# Patient Record
Sex: Female | Born: 2000 | Race: White | Hispanic: No | Marital: Single | Smoking: Never smoker
Health system: Southern US, Community
[De-identification: ages and names within clinical notes are randomized; demographics above are authoritative.]

## PROBLEM LIST (undated history)

## (undated) HISTORY — PX: TONSILLECTOMY: SUR1361

---
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
## 2019-01-12 DIAGNOSIS — Z20828 Contact with and (suspected) exposure to other viral communicable diseases: Secondary | ICD-10-CM | POA: Diagnosis not present

## 2019-01-17 DIAGNOSIS — Z20828 Contact with and (suspected) exposure to other viral communicable diseases: Secondary | ICD-10-CM | POA: Diagnosis not present

## 2019-03-16 ENCOUNTER — Telehealth: Payer: Self-pay | Admitting: *Deleted

## 2019-03-16 NOTE — Telephone Encounter (Signed)
Called pt to go over screening questions and phone only rang and then said call could not be completed at this time.Daielle Melcher Zimmerman Rumple, CMA

## 2019-03-17 ENCOUNTER — Ambulatory Visit (INDEPENDENT_AMBULATORY_CARE_PROVIDER_SITE_OTHER): Payer: Medicaid Other | Admitting: Family Medicine

## 2019-03-17 ENCOUNTER — Encounter: Payer: Self-pay | Admitting: Family Medicine

## 2019-03-17 ENCOUNTER — Ambulatory Visit (HOSPITAL_COMMUNITY)
Admission: RE | Admit: 2019-03-17 | Discharge: 2019-03-17 | Disposition: A | Discharge status: Home / Self Care | Payer: Medicaid Other | Source: Ambulatory Visit | Attending: Family Medicine | Admitting: Family Medicine

## 2019-03-17 ENCOUNTER — Other Ambulatory Visit: Payer: Self-pay

## 2019-03-17 VITALS — BP 94/54 | HR 99 | Wt 148.0 lb

## 2019-03-17 DIAGNOSIS — R55 Syncope and collapse: Secondary | ICD-10-CM | POA: Insufficient documentation

## 2019-03-17 NOTE — Progress Notes (Signed)
CHIEF COMPLAINT / HPI:  Syncope Kerri Mann reports an episode of syncope 3 days ago.  Late Sunday night/early Monday morning, around 4 AM, she woke and went to the bathroom.  Her sister was also present to the bathroom at that time and witnessed this event.  She entered the bathroom began to feel a tightness or heaviness in her chest followed shortly by a loss of hearing and unfocused vision.  She lost consciousness and fell to the ground with the aid of her sister.  Her sister reports that she stopped breathing for a period of 10 seconds while she was on the ground.  She noted some mild spasms of the arm but no generalized shaking.  No evidence of foaming at the mouth, urinary or fecal incontinence.  She regained consciousness without clear evidence of confusion and stood up.  Upon standing, she again passed out.  When she regained consciousness from the second syncopal episode, she had 1 episode of NB/NB emesis.  She called the clinic later that morning to schedule an appointment to be evaluated.  She previously experienced multiple syncopal episodes when she was 12.  At that time, she experiences episodes as often as twice per week.  She was ultimately evaluated by neurology at which time an EEG was performed and showed no evidence of epilepsy.  She was not put on any medication and ultimately these episodes resolved.  She has had no trouble with syncopal episodes since she was 12 (6 years ago).  She has not experienced similar episodes with exertion though she notes that she frequently feels dizzy with exercise.  She ate a normal meal on Sunday night and does not recall being significantly dehydrated though she notes that she may be someone who does not drink quite as much water as she should.  She reports that she has a healthy relationship with food and has never struggled with body image or eating disorders.  She is currently on birth control.  Mom notes that she has good/healthy relationships  with her friends and family at the moment.  She is not currently in a romantic relationship.  Her mother has some heart issue but she is not positive what it is.  It may be a heart block or an arrhythmia. Kerri Mann has never been evaluated by cardiology.  PERTINENT  PMH / PSH: See above   OBJECTIVE: BP (!) 94/54   Pulse 99   Wt 148 lb (67.1 kg)   SpO2 98%   BMI 27.07 kg/m    General: Alert and cooperative and appears to be in no acute distress Cardio: Normal S1 and S2, no S3 or S4. Rhythm is regular. No murmurs or rubs.   Pulm: Clear to auscultation bilaterally, no crackles, wheezing, or diminished breath sounds. Normal respiratory effort Abdomen: Bowel sounds normal. Abdomen soft and non-tender.  Extremities: No peripheral edema. Warm/ well perfused.  Strong radial and pedal pulses.  Orthostatic vitals as follows  lying down: HR: 95; BP 92/50 Sitting: HR 92; BP 88/44 Standing: HR 124; BP 80/54  ASSESSMENT / PLAN:  Syncope Orthostatic vitals show no significant changes in blood pressure with position changes though an increase in heart rate by 32 beats per second when moving from a seated to standing position.  Dehydration may be contributing to this current episode although there may be more contributing.  Based on a family history of heart issues and a personal history of syncope previously, I would like to investigate further before  settling on dehydration and orthostatic hypotension. -Normal EKG in clinic today -Ambulatory referral to cardiology -Follow-up TSH, CBC, BMP -Discouraged significant exertions prior to evaluation by cardiology -Encouraged to drink more fluids     Matilde Haymaker, MD [place]

## 2019-03-17 NOTE — Patient Instructions (Signed)
I am not positive what led to her syncopal episode.  This may ultimately be nothing.  I do think we should get some basic labs today and I would like you to see cardiology to make sure this is related to your heart.  I will call you with the lab results.  Please call the clinic if you have not heard setting of a cardiology appointment in the next 2 weeks.

## 2019-03-17 NOTE — Assessment & Plan Note (Addendum)
Orthostatic vitals show no significant changes in blood pressure with position changes though an increase in heart rate by 32 beats per second when moving from a seated to standing position.  Dehydration may be contributing to this current episode although there may be more contributing.  Based on a family history of heart issues and a personal history of syncope previously, I would like to investigate further before settling on dehydration and orthostatic hypotension. -Normal EKG in clinic today -Ambulatory referral to cardiology -Follow-up TSH, CBC, BMP -Discouraged significant exertions prior to evaluation by cardiology -Encouraged to drink more fluids

## 2019-03-18 ENCOUNTER — Encounter: Payer: Self-pay | Admitting: Family Medicine

## 2019-03-18 LAB — BASIC METABOLIC PANEL
BUN/Creatinine Ratio: 14 (ref 9–23)
BUN: 10 mg/dL (ref 6–20)
CO2: 21 mmol/L (ref 20–29)
Calcium: 9.4 mg/dL (ref 8.7–10.2)
Chloride: 104 mmol/L (ref 96–106)
Creatinine, Ser: 0.71 mg/dL (ref 0.57–1.00)
GFR calc Af Amer: 143 mL/min/{1.73_m2} (ref >59)
GFR calc non Af Amer: 124 mL/min/{1.73_m2} (ref >59)
Glucose: 86 mg/dL (ref 65–99)
Potassium: 3.9 mmol/L (ref 3.5–5.2)
Sodium: 140 mmol/L (ref 134–144)

## 2019-03-18 LAB — CBC
Hematocrit: 43.5 % (ref 34.0–46.6)
Hemoglobin: 15.2 g/dL (ref 11.1–15.9)
MCH: 30.2 pg (ref 26.6–33.0)
MCHC: 34.9 g/dL (ref 31.5–35.7)
MCV: 86 fL (ref 79–97)
Platelets: 260 10*3/uL (ref 150–450)
RBC: 5.04 x10E6/uL (ref 3.77–5.28)
RDW: 12.2 % (ref 11.7–15.4)
WBC: 6 10*3/uL (ref 3.4–10.8)

## 2019-03-18 LAB — TSH: TSH: 2.06 u[IU]/mL (ref 0.450–4.500)

## 2019-03-30 ENCOUNTER — Telehealth: Payer: Self-pay | Admitting: Family Medicine

## 2019-03-30 NOTE — Telephone Encounter (Signed)
Will forward to MD to place referral for oral surgeon.  Reise Hietala,CMA

## 2019-03-30 NOTE — Telephone Encounter (Signed)
Patient calling for a referral to an oral surgeon to get her wisdom teeth taken out. Patient says her wisdom teeth have become very painful since last Friday and its making her run a fever and miss work. Patient dentist cannot see her until end of March. Please call patient with an update.

## 2019-04-07 ENCOUNTER — Other Ambulatory Visit: Payer: Self-pay | Admitting: Family Medicine

## 2019-04-26 DIAGNOSIS — R000 Tachycardia, unspecified: Secondary | ICD-10-CM | POA: Diagnosis not present

## 2019-04-26 DIAGNOSIS — R1031 Right lower quadrant pain: Secondary | ICD-10-CM | POA: Diagnosis not present

## 2019-04-26 DIAGNOSIS — N10 Acute tubulo-interstitial nephritis: Secondary | ICD-10-CM | POA: Diagnosis not present

## 2019-04-26 DIAGNOSIS — N3289 Other specified disorders of bladder: Secondary | ICD-10-CM | POA: Diagnosis not present

## 2019-04-26 DIAGNOSIS — E876 Hypokalemia: Secondary | ICD-10-CM | POA: Diagnosis not present

## 2019-04-26 DIAGNOSIS — N12 Tubulo-interstitial nephritis, not specified as acute or chronic: Secondary | ICD-10-CM | POA: Diagnosis not present

## 2019-04-26 DIAGNOSIS — N200 Calculus of kidney: Secondary | ICD-10-CM | POA: Diagnosis not present

## 2019-04-27 DIAGNOSIS — N12 Tubulo-interstitial nephritis, not specified as acute or chronic: Secondary | ICD-10-CM | POA: Diagnosis not present

## 2019-04-27 DIAGNOSIS — E876 Hypokalemia: Secondary | ICD-10-CM | POA: Diagnosis not present

## 2019-04-27 DIAGNOSIS — R000 Tachycardia, unspecified: Secondary | ICD-10-CM | POA: Diagnosis not present

## 2019-04-29 MED ORDER — ACETAMINOPHEN 160 MG/5ML PO SUSP
650.00 | ORAL | Status: DC
Start: ? — End: 2019-04-29

## 2019-04-29 MED ORDER — ENOXAPARIN SODIUM 40 MG/0.4ML ~~LOC~~ SOLN
40.00 | SUBCUTANEOUS | Status: DC
Start: 2019-04-28 — End: 2019-04-29

## 2019-04-29 MED ORDER — POTASSIUM CHLORIDE IN NACL 20-0.9 MEQ/L-% IV SOLN
100.00 | INTRAVENOUS | Status: DC
Start: ? — End: 2019-04-29

## 2019-04-29 MED ORDER — GENERIC EXTERNAL MEDICATION
1.00 | Status: DC
Start: ? — End: 2019-04-29

## 2019-04-29 MED ORDER — GENERIC EXTERNAL MEDICATION
Status: DC
Start: ? — End: 2019-04-29

## 2019-04-29 MED ORDER — MORPHINE SULFATE (PF) 2 MG/ML IV SOLN
2.00 | INTRAVENOUS | Status: DC
Start: ? — End: 2019-04-29

## 2019-04-29 NOTE — Progress Notes (Signed)
Patient already has wisdom tooth extraction scheduled for 3/25 with her dentist. Was just hospitalized with Pyelonephritis, treated and discharged. It was recommended she reschedule the tooth extraction once she has recovered from the pyelonephritis.  Peggyann Shoals, DO [facility], PGY-2 04/29/2019 4:42 AM

## 2019-05-03 ENCOUNTER — Ambulatory Visit: Payer: Medicaid Other

## 2019-05-06 ENCOUNTER — Ambulatory Visit: Payer: Medicaid Other | Admitting: Family Medicine

## 2019-05-09 NOTE — Progress Notes (Deleted)
    SUBJECTIVE:   CHIEF COMPLAINT / HPI:   Hospital f/u - pyelonephritis  Admitted from 3/22-3/23 for pyelonephritis. Per Dc summary patient was treated with CTX and discharged with cefdinir. Urine cx showed sensitivity to CTX.   Sinus tachycardia present on admission. Patient improved from 140s to 112 at discharge. Thought to be 2/2 infectious.   PERTINENT  PMH / PSH: ***  OBJECTIVE:   There were no vitals taken for this visit.  ***  ASSESSMENT/PLAN:   No problem-specific Assessment & Plan notes found for this encounter.     Oralia Manis, DO [facility]

## 2019-05-10 ENCOUNTER — Ambulatory Visit: Payer: Medicaid Other | Admitting: Family Medicine

## 2019-05-11 ENCOUNTER — Ambulatory Visit: Payer: Medicaid Other | Admitting: Family Medicine

## 2019-06-18 ENCOUNTER — Encounter: Payer: Self-pay | Admitting: Family Medicine

## 2019-06-18 ENCOUNTER — Ambulatory Visit (INDEPENDENT_AMBULATORY_CARE_PROVIDER_SITE_OTHER): Payer: Medicaid Other | Admitting: Family Medicine

## 2019-06-18 ENCOUNTER — Other Ambulatory Visit: Payer: Self-pay

## 2019-06-18 VITALS — BP 118/72 | HR 98 | Wt 152.2 lb

## 2019-06-18 DIAGNOSIS — R55 Syncope and collapse: Secondary | ICD-10-CM

## 2019-06-18 DIAGNOSIS — Z3042 Encounter for surveillance of injectable contraceptive: Secondary | ICD-10-CM | POA: Diagnosis not present

## 2019-06-18 DIAGNOSIS — Z87448 Personal history of other diseases of urinary system: Secondary | ICD-10-CM

## 2019-06-18 LAB — POCT URINE PREGNANCY: Preg Test, Ur: NEGATIVE

## 2019-06-18 MED ORDER — MEDROXYPROGESTERONE ACETATE 150 MG/ML IM SUSY
150.0000 mg | PREFILLED_SYRINGE | Freq: Once | INTRAMUSCULAR | Status: AC
  Administered 2019-06-18: 150 mg via INTRAMUSCULAR

## 2019-06-18 NOTE — Progress Notes (Signed)
    SUBJECTIVE:   CHIEF COMPLAINT / HPI:   F/u Pyelonephritis: hospitalized in March 2021 with [facility].  Patient reports that at the time she had a urinary tract infection, but did not have time to go to the doctor, so she went to the store, bought Azo over-the-counter, and took that to relieve her symptoms.  Not long after, she started having back pain, fevers, feeling unwell.  Was evaluated at [facility] near [place] and was hospitalized for pyelonephritis and treated with IV antibiotics.  Today, patient reports she is doing much better, denies having any issues or complaints.  Denies back pains, dysuria, urinary urgency, or urinary frequency.  Syncope: Her last visit with Dr. Mirian Mo patient was evaluated for concerns for syncopal or near syncopal episodes.  She was referred to a cardiologist at the time, however reports that the referral did not go through/that she has not been contacted about this.  According to our records, patient was reached out to, however, the phone number for the referral to contact the patient was incorrect.  This number was correct at today's visit.  She has not had any syncopal episodes since her last visit, however, she also reports that she is not working out anymore.  Depo shot: Patient is here today for Depo shot.  Urine pregnancy test negative.  Can administer Depo shot.  PERTINENT  PMH / PSH:  Noncontributory  OBJECTIVE:   BP 118/72   Pulse 98   Wt 152 lb 3.2 oz (69 kg)   SpO2 97%   BMI 27.84 kg/m    Physical exam General: Pleasant patient, no apparent distress, nontoxic-appearing Respiratory: CTA bilaterally, comfortable work of breathing, speaking complete sentences Cardiac: RRR, S1-S2 present, no murmurs appreciated Abdomen: Normal appearance, nontender to palpation, normal bowel sounds appreciated, no masses  ASSESSMENT/PLAN:   Syncope Referral made for cardiology in February 2021, however, patient has not yet been contacted  about this due to incorrect phone number. -Patient's phone number double checked in the computer, [phone].  Referral coordinator contacted to reinstate referral for cardiology for patient to be evaluated for syncopal episodes.  Contraception management Patient due for Depo shot today.  Urine pregnancy test negative. -Depo shot administered  History of pyelonephritis Occurred March 2021, patient treated at [facility] in [place], treated with IV Rocephin and ciprofloxacin. -Patient instructed to contact us sooner than later should she develop signs/symptoms concerning for UTI     Dollene Cleveland, DO [facility]

## 2019-06-18 NOTE — Patient Instructions (Signed)
HALF OF YOUR PLATE SHOULD BE VEGETABLES!!!!!  Potatoes count as a carb/bread.   1/2 plate veggies, 1/4 plate protein (meat), 1/4 plate carb/bread/pasta/rice/noodles  Vegetarian Eating Information Many people may prefer vegetarian eating for religious, environmental, or personal reasons. These diets are often lower in calories, salt, sugar, cholesterol, and saturated and trans fats. Vegetarian eating provides significant health benefits. People who eat a vegetarian diet often have lower rates of:  Obesity.  Diabetes.  Breast and colon cancers.  Cardiovascular and gallbladder diseases. What are the types of vegetarian eating?  Vegetarian eating includes dietary choices that focus on eating mostly vegetables and fruit, grains, beans, nuts, and seeds. There are several different types of vegetarian eating. Talk with a diet and nutrition specialist (dietitian) about what type of vegetarian diet is best for you. Lacto-ovo vegetarian  Recommended foods: fruits and vegetables, milk and dairy, eggs, grains, soy and vegetable protein, beans, nuts, and seeds.  Foods to avoid: meat, poultry, seafood, animal-based broths and gravies, and gelatin. Lacto-vegetarian  Recommended foods: fruits and vegetables, milk and dairy, grains, soy and vegetable protein, beans, nuts, and seeds.  Foods to avoid: meat, poultry, seafood, animal-based broths and gravies, gelatin, and eggs. Vegan  Recommended foods: fruits and vegetables, grains, soy and vegetable protein, beans, nuts, and seeds.  Foods to avoid: meat, poultry, seafood, animal-based broths and gravies, gelatin, eggs, milk and dairy, and honey. What do I need to know about vegetarian eating? All vegetarian diets restrict proteins that come from animals. Foods that come from animals have important nutrients, such as protein, fats, vitamins, and minerals. It is important to get these nutrients from other types of foods. If you think you may not be  getting the right nutrients, or if you do not eat any animal products, talk with your health care provider or dietitian about taking supplements. A dietitian can help determine your individual nutrient needs. What are tips for following this plan? Eat a diet that includes a variety of fruits, vegetables, whole grains, and protein sources. This is important to make sure you get enough of the following nutrients: Protein Healthy protein sources include:  Eggs, milk, and cheese. Soy products. Tofu, tempeh, and textured vegetable protein (TVP). Quinoa. Hemp seeds. Other protein sources include:  Beans, such as black beans or kidney beans. Other legumes, such as lentils and split peas. Nuts, such as almonds, [place] nuts, and pecans. Seeds, such as sunflower seeds. To get the most benefit from plant-based proteins, combine two or more sources of plant protein with whole grains in one dish. Examples include beans and rice, almond butter on bread, or sunflower seeds on noodles. Vitamin B12 Sources of vitamin B12 include:  Cheese and eggs. Breakfast cereals and other prepared products that have vitamin B12 added (fortified products). If you eat a vegan diet, ask your health care provider or dietitian about taking a B12 supplement. Vitamin D Good sources of vitamin D include:  Egg yolks. Fortified dairy products. Fortified orange juice. Mushrooms. Cereals with added vitamin D. Another way of getting vitamin D is to spend 10 minutes each day in the sun. This helps your body make its own vitamin D. Depending on your age, you may need to take a vitamin D supplement. Talk with your health care provider or dietitian about how much vitamin D you need in a supplement. Iron Healthy sources of iron include:  Dark, leafy greens. Nuts. Beans. Grain products that are fortified with iron, such as cereals. Tofu, tempeh, soybeans, and  quinoa. To get the most iron from plant-based foods:  Eat iron-containing  plant-based foods with vitamin C. For example, squeeze fresh lemon juice over cooked greens like kale, chard, or spinach, or have a glass of orange juice with your meals.  Avoid eating dairy products, coffee, or tea with iron-containing foods. Omega-3 fatty acids Good sources of omega-3 fatty acids include:  Walnuts. Flax seeds, canola oil, soybean oil, and tofu. Avocados. Olives and olive oil. Foods with added omega-3 fatty acids, such as eggs, milk, and juices. Calcium Good sources of calcium include:  Dairy products. Fortified non-dairy milk. Fortified tofu. Dark, leafy greens, such as kale, bok choy, Chinese cabbage, collard greens and mustard greens. Broccoli. Okra. Fortified breakfast cereals and fruit juices. Figs. Zinc Good sources of zinc include:  Pumpkin seeds. Legumes, such as chickpeas, kidney beans, and green peas. Wheat germ, whole grains, and fortified cereals. Mushrooms. Spinach and kale. Milk and dairy foods. Dark chocolate. Summary  Vegetarian eating is a choice made by people who prefer vegetarian eating for religious, environmental, or personal reasons. These diets can provide significant health benefits.  There are several types of vegetarian diets, but all restrict proteins that come from animals.  It is important to make sure that you are getting enough nutrients, including protein, vitamin B12, vitamin D, iron, omega-3 fatty acids, calcium, and zinc from your diet.  If you think you are not getting the right nutrients or if you do not eat any animal products, talk with your health care provider or dietitian. This information is not intended to replace advice given to you by your health care provider. Make sure you discuss any questions you have with your health care provider. Document Revised: 03/26/2016 Document Reviewed: 03/26/2016 Elsevier Patient Education  2020 [employer].

## 2019-06-22 DIAGNOSIS — Z87448 Personal history of other diseases of urinary system: Secondary | ICD-10-CM | POA: Insufficient documentation

## 2019-06-22 DIAGNOSIS — Z23 Encounter for immunization: Secondary | ICD-10-CM | POA: Diagnosis not present

## 2019-06-22 NOTE — Assessment & Plan Note (Signed)
Referral made for cardiology in February 2021, however, patient has not yet been contacted about this due to incorrect phone number. -Patient's phone number double checked in the computer, [phone].  Referral coordinator contacted to reinstate referral for cardiology for patient to be evaluated for syncopal episodes.

## 2019-06-22 NOTE — Assessment & Plan Note (Signed)
Occurred March 2021, patient treated at [facility] in [place], treated with IV Rocephin and ciprofloxacin. -Patient instructed to contact us sooner than later should she develop signs/symptoms concerning for UTI

## 2019-06-22 NOTE — Assessment & Plan Note (Signed)
Patient due for Depo shot today.  Urine pregnancy test negative. -Depo shot administered

## 2019-07-20 ENCOUNTER — Other Ambulatory Visit: Payer: Self-pay

## 2019-07-20 ENCOUNTER — Encounter: Payer: Self-pay | Admitting: Family Medicine

## 2019-07-20 ENCOUNTER — Emergency Department (HOSPITAL_COMMUNITY): Payer: Medicaid Other

## 2019-07-20 ENCOUNTER — Emergency Department (HOSPITAL_COMMUNITY)
Admission: EM | Admit: 2019-07-20 | Discharge: 2019-07-20 | Disposition: A | Discharge status: Home / Self Care | Payer: Medicaid Other | Attending: Emergency Medicine | Admitting: Emergency Medicine

## 2019-07-20 ENCOUNTER — Ambulatory Visit (INDEPENDENT_AMBULATORY_CARE_PROVIDER_SITE_OTHER): Payer: Medicaid Other | Admitting: Family Medicine

## 2019-07-20 VITALS — BP 92/64 | HR 99 | Ht 62.0 in | Wt 153.4 lb

## 2019-07-20 DIAGNOSIS — R1031 Right lower quadrant pain: Secondary | ICD-10-CM | POA: Insufficient documentation

## 2019-07-20 DIAGNOSIS — R1110 Vomiting, unspecified: Secondary | ICD-10-CM | POA: Diagnosis not present

## 2019-07-20 DIAGNOSIS — R112 Nausea with vomiting, unspecified: Secondary | ICD-10-CM | POA: Diagnosis not present

## 2019-07-20 DIAGNOSIS — R110 Nausea: Secondary | ICD-10-CM | POA: Diagnosis not present

## 2019-07-20 LAB — CBC
HCT: 46.3 % — ABNORMAL HIGH (ref 36.0–46.0)
Hemoglobin: 15.5 g/dL — ABNORMAL HIGH (ref 12.0–15.0)
MCH: 29.6 pg (ref 26.0–34.0)
MCHC: 33.5 g/dL (ref 30.0–36.0)
MCV: 88.4 fL (ref 80.0–100.0)
Platelets: 263 10*3/uL (ref 150–400)
RBC: 5.24 MIL/uL — ABNORMAL HIGH (ref 3.87–5.11)
RDW: 12.9 % (ref 11.5–15.5)
WBC: 8.2 10*3/uL (ref 4.0–10.5)
nRBC: 0 % (ref 0.0–0.2)

## 2019-07-20 LAB — COMPREHENSIVE METABOLIC PANEL
ALT: 27 U/L (ref 0–44)
AST: 24 U/L (ref 15–41)
Albumin: 3.9 g/dL (ref 3.5–5.0)
Alkaline Phosphatase: 74 U/L (ref 38–126)
Anion gap: 8 (ref 5–15)
BUN: 8 mg/dL (ref 6–20)
CO2: 20 mmol/L — ABNORMAL LOW (ref 22–32)
Calcium: 9.3 mg/dL (ref 8.9–10.3)
Chloride: 111 mmol/L (ref 98–111)
Creatinine, Ser: 0.84 mg/dL (ref 0.44–1.00)
GFR calc Af Amer: >60 mL/min (ref >60)
GFR calc non Af Amer: >60 mL/min (ref >60)
Glucose, Bld: 98 mg/dL (ref 70–99)
Potassium: 4.1 mmol/L (ref 3.5–5.1)
Sodium: 139 mmol/L (ref 135–145)
Total Bilirubin: 0.9 mg/dL (ref 0.3–1.2)
Total Protein: 7.5 g/dL (ref 6.5–8.1)

## 2019-07-20 LAB — LIPASE, BLOOD: Lipase: 19 U/L (ref 11–51)

## 2019-07-20 LAB — URINALYSIS, ROUTINE W REFLEX MICROSCOPIC
Bilirubin Urine: NEGATIVE
Glucose, UA: NEGATIVE mg/dL
Ketones, ur: NEGATIVE mg/dL
Leukocytes,Ua: NEGATIVE
Nitrite: NEGATIVE
Protein, ur: NEGATIVE mg/dL
Specific Gravity, Urine: 1.006 (ref 1.005–1.030)
pH: 5 (ref 5.0–8.0)

## 2019-07-20 LAB — WET PREP, GENITAL
Clue Cells Wet Prep HPF POC: NONE SEEN
Sperm: NONE SEEN
Trich, Wet Prep: NONE SEEN
Yeast Wet Prep HPF POC: NONE SEEN

## 2019-07-20 LAB — I-STAT BETA HCG BLOOD, ED (MC, WL, AP ONLY): I-stat hCG, quantitative: <5 m[IU]/mL (ref <5)

## 2019-07-20 MED ORDER — KETOROLAC TROMETHAMINE 30 MG/ML IJ SOLN
30.0000 mg | Freq: Once | INTRAMUSCULAR | Status: AC
  Administered 2019-07-20: 30 mg via INTRAVENOUS
  Filled 2019-07-20: qty 1

## 2019-07-20 MED ORDER — IOHEXOL 300 MG/ML  SOLN
100.0000 mL | Freq: Once | INTRAMUSCULAR | Status: AC | PRN
  Administered 2019-07-20: 100 mL via INTRAVENOUS

## 2019-07-20 MED ORDER — ONDANSETRON HCL 4 MG/2ML IJ SOLN
4.0000 mg | Freq: Once | INTRAMUSCULAR | Status: AC
  Administered 2019-07-20: 4 mg via INTRAVENOUS
  Filled 2019-07-20: qty 2

## 2019-07-20 MED ORDER — SODIUM CHLORIDE 0.9% FLUSH
3.0000 mL | Freq: Once | INTRAVENOUS | Status: AC
  Administered 2019-07-20: 3 mL via INTRAVENOUS

## 2019-07-20 NOTE — ED Provider Notes (Signed)
[facility] EMERGENCY DEPARTMENT Provider Note   CSN: [id number] Arrival date & time: 07/20/19  4132     History Chief Complaint  Patient presents with  . Abdominal Pain    right side    Kerri Mann is a 19 y.o. female with history of recent hospitalization for pyelonephritis presents to the ER for evaluation of right lower abdominal pain that began on Sunday.  This was sudden, moderate to severe.  Had associated nausea with one episode of vomiting that day.  The pain has been constant since with lingering nausea but no longer having vomiting.  Pain is worse with walking, palpation and moving.  Has noticed increased urination.  Had "low-grade fever" on Sunday 99.2 but none since.  Denies dysuria, hematuria, malodorous urine.  Denies changes in vaginal discharge or abnormal discharge.  Denies abnormal vaginal bleeding.  She has history of irregular menses.  She is not concerned about an STD.  Is sexually active with men with 100% condom use.  She called her primary care doctor and he referred her to the ER for evaluation.  Denies history of kidney stones.  No history of ovarian cysts, fibroids.  No abdominal surgeries.  Patient was admitted to the hospital for right-sided pyelonephritis 2 months ago.  Other associate symptoms.  No alleviating factors.  Her pain is currently 6/10, sharp, constant.  No radiating pain.  HPI     No past medical history on file.  Patient Active Problem List   Diagnosis Date Noted  . RLQ abdominal pain 07/20/2019  . History of pyelonephritis 06/22/2019  . Syncope 03/17/2019  . URI (upper respiratory infection) 12/09/2018  . Eustachian tube dysfunction 09/22/2018  . 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 Acute otitis externa of right ear 07/21/2018  . Depression 07/11/2017  . Headache 05/02/2017  . Contraception management 02/02/2015  . 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 Irregular menstrual cycle 06/29/2014    No past  surgical history on file.   OB History   No obstetric history on file.     Family History  Problem Relation Age of Onset  . Healthy Mother     Social History   Tobacco Use  . Smoking status: Never Smoker  . Smokeless tobacco: Never Used  Vaping Use  . Vaping Use: Never used  Substance Use Topics  . Alcohol use: No  . Drug use: No    Home Medications Prior to Admission medications   Medication Sig Start Date End Date Taking? Authorizing Provider  cetirizine (ZYRTEC) 10 MG tablet Take 1 tablet (10 mg total) by mouth daily as needed for allergies. Patient taking differently: Take 10 mg by mouth every other day.  06/10/18  Yes Rumball, Jill Side, DO  fluticasone (FLONASE) 50 MCG/ACT nasal spray Place 2 sprays into both nostrils daily. Patient taking differently: Place 2 sprays into both nostrils daily as needed for allergies.  09/22/18  Yes Ellwood Dense, DO  ibuprofen (ADVIL) 200 MG tablet Take 200 mg by mouth every 6 (six) hours as needed for moderate pain.   Yes [provider]  loratadine (CLARITIN) 10 MG tablet Take 10 mg by mouth every other day.   Yes [provider]    Allergies    Patient has no known allergies.  Review of Systems   Review of Systems  Constitutional: Positive for fever (Low-grade, resolved).  Gastrointestinal: Positive for abdominal pain, nausea and vomiting (X1, resolved).  Genitourinary: Positive for frequency.  All other systems  reviewed and are negative.   Physical Exam Updated Vital Signs BP 127/83 (BP Location: Left Arm)   Pulse 96   Temp 98.8 F (37.1 C) (Oral)   Resp 17   Ht 5\' 2"  (1.575 m)   Wt 68 kg   SpO2 98%   BMI 27.44 kg/m   Physical Exam Vitals and nursing note reviewed.  Constitutional:      Appearance: She is well-developed.     Comments: Non toxic in NAD  HENT:     Head: Normocephalic and atraumatic.     Nose: Nose normal.  Eyes:     Conjunctiva/sclera: Conjunctivae normal.  Cardiovascular:      Rate and Rhythm: Normal rate and regular rhythm.  Pulmonary:     Effort: Pulmonary effort is normal.     Breath sounds: Normal breath sounds.  Abdominal:     General: Bowel sounds are normal.     Palpations: Abdomen is soft.     Tenderness: There is abdominal tenderness in the right lower quadrant. Positive signs include Rovsing's sign and McBurney's sign.     Comments: No G/R/R. No suprapubic or CVA tenderness. Negative Murphy's. Active BS to lower quadrants.   Genitourinary:    Comments:  Exam performed with EMT at bedside for assistance. External genitalia without lesions.  No groin lymphadenopathy.  Vaginal mucosa and cervix pink without lesions.  Scant likely physiologic clear/white discharge in vaginal vault noted.  No CMT.  Nonpalpable, nontender adnexa.  Perianal skin normal without lesions. Musculoskeletal:        General: Normal range of motion.     Cervical back: Normal range of motion.  Skin:    General: Skin is warm and dry.     Capillary Refill: Capillary refill takes less than 2 seconds.  Neurological:     Mental Status: She is alert.  Psychiatric:        Behavior: Behavior normal.     ED Results / Procedures / Treatments   Labs (all labs ordered are listed, but only abnormal results are displayed) Labs Reviewed  WET PREP, GENITAL - Abnormal; Notable for the following components:      Result Value   WBC, Wet Prep HPF POC FEW (*)    All other components within normal limits  COMPREHENSIVE METABOLIC PANEL - Abnormal; Notable for the following components:   CO2 20 (*)    All other components within normal limits  CBC - Abnormal; Notable for the following components:   RBC 5.24 (*)    Hemoglobin 15.5 (*)    HCT 46.3 (*)    All other components within normal limits  URINALYSIS, ROUTINE W REFLEX MICROSCOPIC - Abnormal; Notable for the following components:   Color, Urine STRAW (*)    Hgb urine dipstick SMALL (*)    Bacteria, UA RARE (*)    All other components  within normal limits  URINE CULTURE  LIPASE, BLOOD  I-STAT BETA HCG BLOOD, ED ([facility], [facility], [facility] ONLY)  GC/CHLAMYDIA PROBE AMP ([facility]) NOT AT [facility]    EKG None  Radiology CT ABDOMEN PELVIS W CONTRAST  Result Date: 07/20/2019 CLINICAL DATA:  Right lower quadrant pain, nausea and vomiting beginning 07/18/2019. EXAM: CT ABDOMEN AND PELVIS WITH CONTRAST TECHNIQUE: Multidetector CT imaging of the abdomen and pelvis was performed using the standard protocol following bolus administration of intravenous contrast. CONTRAST:  100 mL OMNIPAQUE IOHEXOL 300 MG/ML  SOLN COMPARISON:  None. FINDINGS: Lower chest: Lung bases clear.  No pleural or pericardial  effusion. Hepatobiliary: No focal liver abnormality is seen. No gallstones, gallbladder wall thickening, or biliary dilatation. Pancreas: Unremarkable. No pancreatic ductal dilatation or surrounding inflammatory changes. Spleen: Normal in size without focal abnormality. Adrenals/Urinary Tract: Adrenal glands are unremarkable. Kidneys are normal, without renal calculi, focal lesion, or hydronephrosis. Bladder is unremarkable. Stomach/Bowel: Stomach is within normal limits. Appendix appears normal. No evidence of bowel wall thickening, distention, or inflammatory changes. Vascular/Lymphatic: No significant vascular findings are present. No enlarged abdominal or pelvic lymph nodes. Reproductive: Uterus and bilateral adnexa are unremarkable. Other: None. Musculoskeletal: Negative. IMPRESSION: Negative for appendicitis.  Negative abdomen pelvis CT. Electronically Signed   By: Drusilla Kanner M.D.   On: 07/20/2019 16:54    Procedures Procedures (including critical care time)  Medications Ordered in ED Medications  sodium chloride flush (NS) 0.9 % injection 3 mL (3 mLs Intravenous Given 07/20/19 1248)  ketorolac (TORADOL) 30 MG/ML injection 30 mg (30 mg Intravenous Given 07/20/19 1308)  ondansetron (ZOFRAN) injection 4 mg (4 mg Intravenous Given 07/20/19 1308)    iohexol (OMNIPAQUE) 300 MG/ML solution 100 mL (100 mLs Intravenous Contrast Given 07/20/19 1639)    ED Course  I have reviewed the triage vital signs and the nursing notes.  Pertinent labs & imaging results that were available during my care of the patient were reviewed by me and considered in my medical decision making (see chart for details).  Clinical Course as of Jul 20 1747  Tue Jul 20, 2019  1255 Hgb urine dipstick(!): SMALL [CG]  1255 RBC / HPF: 0-5 [CG]  1255 Bacteria, UA(!): RARE [CG]  1255 Mucus: PRESENT [CG]  1255 Hemoglobin(!): 15.5 [CG]  1255 HCT(!): 46.3 [CG]  1255 WBC: 8.2 [CG]  1255 I-stat hCG, quantitative: <5.0 [CG]    Clinical Course User Index [CG] Liberty Handy, PA-C   MDM Rules/Calculators/A&P                          This patient complains of sudden onset right lower quadrant nonradiating pain for 3 days.  Had nausea and vomiting x1, lingering nausea since with increased urination.  Recent hospitalization for pyelonephritis.  Obtained additional history from chart review, nursing notes.  Previous medical records available, nursing notes reviewed to obtain more history and assist with MDM  Chief complain involves an extensive number of treatment options and is a complaint that carries with it a high risk of complications and morbidity and mortality.   Highest on differential diagnosis includes appendicitis, right-sided diverticulitis, kidney stone or pyelonephritis.  Patient's pelvic exam was noncontributory.  She has no concern for STD.  No CMT.  No abnormal vaginal discharge or bleeding.  Pregnancy test is negative.  Exam makes GU process like ectopic pregnancy, ovarian torsion, POA unlikely.  I ordered medication including Zofran, Toradol.  I ordered laboratory studies including screening labs, urinalysis, wet prep, gonorrhea and Chlamydia probe.  Given benign pelvic exam, no GU complaints I ordered imaging studies including CT A/P.  1748: I  ordered, reviewed and personally visualized and interpreted the above labs and/or imaging studies.    ER work up reveals minimally elevated hemoglobin/HCT, hemoconcentration.  Rare bacteria and hemoglobin in the urine but no other signs of infection, RBCs.  CT scan is nonacute.  I have re-evaluated the patient and no clinical decline.  Had complete improvement in symptoms after Toradol and antiemetic.  Discussed ER work-up with patient, reassured her benign results.  Recommended discharge with monitoring of symptoms.  Return precautions given.  She is comfortable with this plan.  Final Clinical Impression(s) / ED Diagnoses Final diagnoses:  RLQ abdominal pain    Rx / DC Orders ED Discharge Orders    None       Liberty Handy, PA-C 07/20/19 1749    Gerhard Munch, MD 07/21/19 [phone]

## 2019-07-20 NOTE — Progress Notes (Signed)
° ° °  SUBJECTIVE:   CHIEF COMPLAINT / HPI:   Abdominal pain and HA Onset: Sudden Location/radiation: RLQ abdominal pain Duration: constant Character: sharp Aggrevating factors: standing, coughing,  Reliving factors: heating pad, ibuprofen Timing: when moving Severity: 7/10 Highest temp: 99.29F Associated symptoms: vomiting x1, no dysuria, no infrequency, no vaginal discharge or vaginal bleeding, on depomedrol (does not have a period that is regular), patient is sexually active How pregnancy test is negative, no dyspurenia,  Now able to tolerate PO fluids this AM Uses condoms when having sex Does have appendix Gets HA when doesn't eat, does not endorse one right now  Patient declined cervical exam.      OBJECTIVE:   BP 92/64    Pulse 99    Ht 5\' 2"  (1.575 m)    Wt 153 lb 6 oz (69.6 kg)    SpO2 98%    BMI 28.05 kg/m   Gen: appears slightly uncomfortable HEENT: PERRLA, EOMI, peripheral vision fields are intact CV: RRR with no murmurs appreciated Pulm: NWOB, CTAB with no crackles, wheezes, or rhonchi GI: moderate RLQ tenderness, w/o rebound or guarding MSK: no edema, cyanosis, or clubbing noted Skin: warm, dry Neuro: grossly normal, moves all extremities Psych: Normal affect and thought content   ASSESSMENT/PLAN:   RLQ abdominal pain Focal RLQ tenderness w/ vomiting, decrease PO. VSS. Is worsening though. Concern for appendicitis, less likely ovarian torsion. Can not r/o ectopic, although less likely bc patient is on depo. No dysuria, urgency makes UTI less likely. No vaginal bleeding or discharge makes PID less likely, although should likely need to evaluation for this too. Given the acuity, recommend patient go to the ED for lab work and imaging. Patient stable, so she can take herself. She agrees with the plan.      , MD [facility]

## 2019-07-20 NOTE — Assessment & Plan Note (Addendum)
Focal RLQ tenderness w/ vomiting, decrease PO. VSS. Pain Is worsening though. Concern for appendicitis, less likely ovarian torsion. Can not r/o ectopic, although less likely bc patient is on depo. No dysuria, urgency makes UTI less likely. No vaginal bleeding or discharge makes PID less likely, although should likely need to evaluation for this too. Given the acuity, recommend patient go to the ED for lab work and imaging. Patient stable, so she can take herself. She agrees with the plan.

## 2019-07-20 NOTE — ED Triage Notes (Signed)
Pt. Stated, My Dr. Lisabeth Devoid I might have Appendicitis

## 2019-07-20 NOTE — ED Triage Notes (Signed)
Pt. Stated, I was sent by my dr. , I have right sided stomach pain.

## 2019-07-20 NOTE — Discharge Instructions (Addendum)
You were seen in the ER for abdominal pain.  Urinalysis did not show any signs of blood or infection.  We have sent the urine for culture to rule out infection.  CT scan was obtained and it was normal.  It is unclear what is causing your pain.  I recommend monitoring for return of pain or new development of symptoms.  Return to the ER if there is high fever, severe constant pain, consistent vomiting, any urinary symptoms, diarrhea or blood in your stools.

## 2019-07-21 LAB — GC/CHLAMYDIA PROBE AMP (~~LOC~~) NOT AT ARMC
Chlamydia: NEGATIVE
Comment: NEGATIVE
Comment: NORMAL
Neisseria Gonorrhea: NEGATIVE

## 2019-07-21 LAB — URINE CULTURE

## 2019-07-22 DIAGNOSIS — R0602 Shortness of breath: Secondary | ICD-10-CM | POA: Diagnosis not present

## 2019-07-22 DIAGNOSIS — Z3202 Encounter for pregnancy test, result negative: Secondary | ICD-10-CM | POA: Diagnosis not present

## 2019-07-22 DIAGNOSIS — R112 Nausea with vomiting, unspecified: Secondary | ICD-10-CM | POA: Diagnosis not present

## 2019-07-22 DIAGNOSIS — R1031 Right lower quadrant pain: Secondary | ICD-10-CM | POA: Diagnosis not present

## 2019-07-22 DIAGNOSIS — R1084 Generalized abdominal pain: Secondary | ICD-10-CM | POA: Diagnosis not present

## 2019-07-23 DIAGNOSIS — R0602 Shortness of breath: Secondary | ICD-10-CM | POA: Diagnosis not present

## 2019-08-18 NOTE — Progress Notes (Signed)
Pt cancelled appointment.

## 2019-08-19 ENCOUNTER — Ambulatory Visit (INDEPENDENT_AMBULATORY_CARE_PROVIDER_SITE_OTHER): Payer: Medicaid Other | Admitting: Family Medicine

## 2019-08-19 DIAGNOSIS — Z5329 Procedure and treatment not carried out because of patient's decision for other reasons: Secondary | ICD-10-CM

## 2019-08-23 ENCOUNTER — Ambulatory Visit: Payer: Medicaid Other | Admitting: Family Medicine

## 2019-08-23 NOTE — Progress Notes (Deleted)
? ? ?  SUBJECTIVE:  ? ?CHIEF COMPLAINT / HPI:  ? ?*** ? ?PERTINENT  PMH / PSH: *** ? ?OBJECTIVE:  ? ?There were no vitals taken for this visit.  ?*** ? ?ASSESSMENT/PLAN:  ? ?No problem-specific Assessment & Plan notes found for this encounter. ?  ? ? ?Victor Solly Derasmo, MD ?[facility]   ?

## 2019-09-07 ENCOUNTER — Ambulatory Visit (INDEPENDENT_AMBULATORY_CARE_PROVIDER_SITE_OTHER): Payer: Medicaid Other

## 2019-09-07 ENCOUNTER — Other Ambulatory Visit: Payer: Self-pay

## 2019-09-07 DIAGNOSIS — Z3042 Encounter for surveillance of injectable contraceptive: Secondary | ICD-10-CM | POA: Diagnosis not present

## 2019-09-07 MED ORDER — MEDROXYPROGESTERONE ACETATE 150 MG/ML IM SUSP
150.0000 mg | Freq: Once | INTRAMUSCULAR | Status: AC
  Administered 2019-09-07: 150 mg via INTRAMUSCULAR

## 2019-09-07 NOTE — Progress Notes (Signed)
Patient here today for Depo Provera injection and is within her dates.    Last contraceptive appt was 06/18/2019.  Depo given in [facility] today. Site unremarkable & patient tolerated injection.    Next injection due 11/23/2019-12/07/2019. Reminder card given.

## 2019-09-27 ENCOUNTER — Ambulatory Visit (INDEPENDENT_AMBULATORY_CARE_PROVIDER_SITE_OTHER): Payer: Medicaid Other | Admitting: Family Medicine

## 2019-09-27 ENCOUNTER — Other Ambulatory Visit: Payer: Self-pay

## 2019-09-27 DIAGNOSIS — J0110 Acute frontal sinusitis, unspecified: Secondary | ICD-10-CM

## 2019-09-27 DIAGNOSIS — J329 Chronic sinusitis, unspecified: Secondary | ICD-10-CM | POA: Insufficient documentation

## 2019-09-27 NOTE — Progress Notes (Signed)
° ° °  SUBJECTIVE:   CHIEF COMPLAINT / HPI:   Sinusitis Kerri Mann presents to the clinic with roughly 1 day of significant headache, fever and occasional cough.  She notes that her cough is been going on for about the past week and she has been taking over-the-counter cough and cold medicine.  Her cough was not particularly bothersome to her.  The symptoms that she is primarily concerned about her the fever up to 101.9 which started last night and her significant headache.  She has been taking Motrin as frequently as indicated on the package and that helps her symptoms until the medication wears off.  She is also experiencing pressure around her eyes and forehead.  She has a previous medical history significant for frequent ear infections 8 ear infections last year.  She denies sore throat, nausea, stomach pain, diarrhea.  She has no known exposure to coronavirus and notes that she was vaccinated on 06/22/19.  PERTINENT  PMH / PSH: Eustachian tube dysfunction, frequent infections.  OBJECTIVE:   BP (!) 100/58    Pulse 85    Temp 98.5 F (36.9 C) (Oral)    Ht 5\' 3"  (1.6 m)    SpO2 98%    BMI 26.57 kg/m    General: Seated comfortably in her chair in the exam room.  Low energy, appears fatigued. HEENT: TMs visualized.  No tonsillar enlargement.  Tenderness to the frontal sinuses.  No cervical lymphadenopathy.  Respiratory: Breathing comfortably on room air.  Clear to auscultation bilaterally. Cardiac: Regular rate and rhythm  ASSESSMENT/PLAN:   Sinusitis She was informed that she has a sinus infection.  At this point, most likely viral. -She was encouraged to be tested for coronavirus -Saying that she is appropriate to return to work once she is tested negative for Covid -Tylenol and Motrin for discomfort -Clinic for worsening symptoms or symptoms lasting longer than 10 days to consider antibiotic     , MD [facility]

## 2019-09-27 NOTE — Patient Instructions (Signed)
Sinusitis: Based on her history and physical, does look like you have a sinus infection.  At this time, it is most likely a viral infection.  Antibiotics would not be helpful.  If your symptoms persist for longer than 10 days, please come back and we will treat you with antibiotics.  For now, I would encourage you to take Tylenol and Motrin as needed for your fever and discomfort.  Even though you have had the vaccine, I do recommend you get tested for Covid.

## 2019-09-27 NOTE — Assessment & Plan Note (Signed)
She was informed that she has a sinus infection.  At this point, most likely viral. -She was encouraged to be tested for coronavirus -Saying that she is appropriate to return to work once she is tested negative for Covid -Tylenol and Motrin for discomfort -Clinic for worsening symptoms or symptoms lasting longer than 10 days to consider antibiotic

## 2019-09-29 DIAGNOSIS — Z20822 Contact with and (suspected) exposure to covid-19: Secondary | ICD-10-CM | POA: Diagnosis not present

## 2019-10-17 DIAGNOSIS — R519 Headache, unspecified: Secondary | ICD-10-CM | POA: Diagnosis not present

## 2019-10-17 DIAGNOSIS — Z20822 Contact with and (suspected) exposure to covid-19: Secondary | ICD-10-CM | POA: Diagnosis not present

## 2019-10-17 DIAGNOSIS — R509 Fever, unspecified: Secondary | ICD-10-CM | POA: Diagnosis not present

## 2019-10-17 DIAGNOSIS — M791 Myalgia, unspecified site: Secondary | ICD-10-CM | POA: Diagnosis not present

## 2019-10-18 ENCOUNTER — Other Ambulatory Visit: Payer: Self-pay

## 2019-10-18 ENCOUNTER — Ambulatory Visit (HOSPITAL_COMMUNITY)
Admission: EM | Admit: 2019-10-18 | Discharge: 2019-10-18 | Disposition: A | Discharge status: Home / Self Care | Payer: Medicaid Other | Attending: Internal Medicine | Admitting: Internal Medicine

## 2019-10-18 ENCOUNTER — Encounter (HOSPITAL_COMMUNITY): Payer: Self-pay | Admitting: Emergency Medicine

## 2019-10-18 DIAGNOSIS — R509 Fever, unspecified: Secondary | ICD-10-CM | POA: Insufficient documentation

## 2019-10-18 DIAGNOSIS — R519 Headache, unspecified: Secondary | ICD-10-CM | POA: Diagnosis not present

## 2019-10-18 DIAGNOSIS — Z20822 Contact with and (suspected) exposure to covid-19: Secondary | ICD-10-CM | POA: Diagnosis not present

## 2019-10-18 DIAGNOSIS — R05 Cough: Secondary | ICD-10-CM | POA: Insufficient documentation

## 2019-10-18 DIAGNOSIS — N390 Urinary tract infection, site not specified: Secondary | ICD-10-CM | POA: Insufficient documentation

## 2019-10-18 DIAGNOSIS — Z79899 Other long term (current) drug therapy: Secondary | ICD-10-CM | POA: Insufficient documentation

## 2019-10-18 LAB — CBC WITH DIFFERENTIAL/PLATELET
Abs Immature Granulocytes: 0.19 10*3/uL — ABNORMAL HIGH (ref 0.00–0.07)
Basophils Absolute: 0.1 10*3/uL (ref 0.0–0.1)
Basophils Relative: 1 %
Eosinophils Absolute: 0.1 10*3/uL (ref 0.0–0.5)
Eosinophils Relative: 1 %
HCT: 40.9 % (ref 36.0–46.0)
Hemoglobin: 13.8 g/dL (ref 12.0–15.0)
Immature Granulocytes: 2 %
Lymphocytes Relative: 30 %
Lymphs Abs: 3.8 10*3/uL (ref 0.7–4.0)
MCH: 29.9 pg (ref 26.0–34.0)
MCHC: 33.7 g/dL (ref 30.0–36.0)
MCV: 88.7 fL (ref 80.0–100.0)
Monocytes Absolute: 1.3 10*3/uL — ABNORMAL HIGH (ref 0.1–1.0)
Monocytes Relative: 10 %
Neutro Abs: 7.4 10*3/uL (ref 1.7–7.7)
Neutrophils Relative %: 56 %
Platelets: 342 10*3/uL (ref 150–400)
RBC: 4.61 MIL/uL (ref 3.87–5.11)
RDW: 11.9 % (ref 11.5–15.5)
WBC: 12.9 10*3/uL — ABNORMAL HIGH (ref 4.0–10.5)
nRBC: 0 % (ref 0.0–0.2)

## 2019-10-18 LAB — POCT URINALYSIS DIPSTICK, ED / UC
Bilirubin Urine: NEGATIVE
Glucose, UA: 100 mg/dL — AB
Ketones, ur: NEGATIVE mg/dL
Nitrite: POSITIVE — AB
Protein, ur: 30 mg/dL — AB
Specific Gravity, Urine: 1.015 (ref 1.005–1.030)
Urobilinogen, UA: 1 mg/dL (ref 0.0–1.0)
pH: 5.5 (ref 5.0–8.0)

## 2019-10-18 MED ORDER — ONDANSETRON HCL 4 MG PO TABS: 4.0000 mg | ORAL_TABLET | Freq: Three times a day (TID) | ORAL | 0 refills | Status: DC | PRN

## 2019-10-18 MED ORDER — DEXAMETHASONE SODIUM PHOSPHATE 10 MG/ML IJ SOLN
INTRAMUSCULAR | Status: AC
  Filled 2019-10-18: qty 1

## 2019-10-18 MED ORDER — DEXAMETHASONE SODIUM PHOSPHATE 10 MG/ML IJ SOLN
10.0000 mg | Freq: Once | INTRAMUSCULAR | Status: AC
  Administered 2019-10-18: 10 mg via INTRAMUSCULAR

## 2019-10-18 MED ORDER — ONDANSETRON 4 MG PO TBDP
ORAL_TABLET | ORAL | Status: AC
  Filled 2019-10-18: qty 1

## 2019-10-18 MED ORDER — NITROFURANTOIN MONOHYD MACRO 100 MG PO CAPS: 100.0000 mg | ORAL_CAPSULE | Freq: Two times a day (BID) | ORAL | 0 refills | Status: AC

## 2019-10-18 MED ORDER — ONDANSETRON 4 MG PO TBDP
4.0000 mg | ORAL_TABLET | Freq: Once | ORAL | Status: AC
  Administered 2019-10-18: 4 mg via ORAL

## 2019-10-18 NOTE — ED Provider Notes (Signed)
[facility]    CSN: [id number] Arrival date & time: 10/18/19  1634      History   Chief Complaint Chief Complaint  Patient presents with   Headache    HPI Kerri Mann is a 19 y.o. female.   Kerri Mann presents with complaints of body aches, headache, fever, nausea and vomiting. Body aches started 9/8. Temp of 100.8. She took tylenol which helped. Woke up with chills. Headache ever since. Head pressure. Bending over worsens the pain. Fever since. Ibuprofen and tylenol helps with the fever. 101.2 Tmax. Started vomiting two days ago. Nausea since. Hasn't eaten at all today, no vomiting today. Has been drinking some water. Occasional abdominal pain. Urinating without urinary symptoms. Some cough. No nasal drainage. No sore throat. Still with headache. Headache does improve some when temperature goes down. No known ill contacts. Works at [employer]. Has gotten her covid-19 vaccine. Negative covid-19 testing last night. No diarrhea.    ROS per HPI, negative if not otherwise mentioned.      History reviewed. No pertinent past medical history.  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      Home Medications    Prior to Admission medications   Medication Sig Start Date End Date Taking? Authorizing Provider  cetirizine (ZYRTEC) 10 MG tablet Take 1 tablet (10 mg total) by  mouth daily as needed for allergies. Patient taking differently: Take 10 mg by mouth every other day.  06/10/18   Ellwood Dense, MD  fluticasone (FLONASE) 50 MCG/ACT nasal spray Place 2 sprays into both nostrils daily. Patient taking differently: Place 2 sprays into both nostrils daily as needed for allergies.  09/22/18   Ellwood Dense, MD  ibuprofen (ADVIL) 200 MG tablet Take 200 mg by mouth every 6 (six) hours as needed for moderate pain.    [provider]  loratadine (CLARITIN) 10 MG tablet Take 10 mg by mouth every other day.    [provider]  nitrofurantoin, macrocrystal-monohydrate, (MACROBID) 100 MG capsule Take 1 capsule (100 mg total) by mouth 2 (two) times daily for 5 days. 10/18/19 10/23/19  Georgetta Haber, NP  ondansetron (ZOFRAN) 4 MG tablet Take 1 tablet (4 mg total) by mouth every 8 (eight) hours as needed for nausea or vomiting. 10/18/19   Georgetta Haber, NP    Family History Family History  Problem Relation Age of Onset   Healthy Mother     Social History Social History   Tobacco Use   Smoking status: Never Smoker   Smokeless tobacco: Never Used  Building services engineer Use: Never used  Substance Use Topics   Alcohol use: No   Drug use: No     Allergies   Patient has no known allergies.   Review of Systems Review of Systems   Physical Exam Triage Vital Signs ED Triage Vitals  Enc Vitals Group  BP 10/18/19 1834 128/81     Pulse Rate 10/18/19 1834 (!) 112     Resp 10/18/19 1834 (!) 22     Temp 10/18/19 1834 99 F (37.2 C)     Temp Source 10/18/19 1834 Oral     SpO2 10/18/19 1834 100 %     Weight --      Height --      Head Circumference --      Peak Flow --      Pain Score 10/18/19 1830 7     Pain Loc --      Pain Edu? --      Excl. in GC? --    No data found.  Updated Vital Signs BP 128/81 (BP Location: Right Arm)    Pulse (!) 112    Temp 99 F (37.2 C) (Oral)    Resp (!) 22    SpO2 100%   Visual Acuity Right  Eye Distance:   Left Eye Distance:   Bilateral Distance:    Right Eye Near:   Left Eye Near:    Bilateral Near:     Physical Exam Constitutional:      General: She is not in acute distress.    Appearance: She is well-developed.  HENT:     Mouth/Throat:     Mouth: Mucous membranes are moist.  Eyes:     Extraocular Movements: Extraocular movements intact.     Pupils: Pupils are equal, round, and reactive to light.  Cardiovascular:     Rate and Rhythm: Normal rate.  Pulmonary:     Effort: Pulmonary effort is normal.  Abdominal:     Tenderness: There is no abdominal tenderness.  Skin:    General: Skin is warm and dry.  Neurological:     Mental Status: She is alert and oriented to person, place, and time.  Psychiatric:        Mood and Affect: Mood normal.      UC Treatments / Results  Labs (all labs ordered are listed, but only abnormal results are displayed) Labs Reviewed  URINE CULTURE - Abnormal; Notable for the following components:      Result Value   Culture   (*)    Value: >=100,000 COLONIES/mL GRAM NEGATIVE RODS IDENTIFICATION AND SUSCEPTIBILITIES TO FOLLOW Performed at [facility], [address], [place], [place] [address]    All other components within normal limits  CBC WITH DIFFERENTIAL/PLATELET - Abnormal; Notable for the following components:   WBC 12.9 (*)    Monocytes Absolute 1.3 (*)    Abs Immature Granulocytes 0.19 (*)    All other components within normal limits  POCT URINALYSIS DIPSTICK, ED / UC - Abnormal; Notable for the following components:   Glucose, UA 100 (*)    Hgb urine dipstick TRACE (*)    Protein, ur 30 (*)    Nitrite POSITIVE (*)    Leukocytes,Ua SMALL (*)    All other components within normal limits  SARS CORONAVIRUS 2 (TAT 6-24 HRS)    EKG   Radiology No results found.  Procedures Procedures (including critical care time)  Medications Ordered in UC Medications  dexamethasone (DECADRON) injection 10 mg (10  mg Intramuscular Given 10/18/19 1922)  ondansetron (ZOFRAN-ODT) disintegrating tablet 4 mg (4 mg Oral Given 10/18/19 1922)    Initial Impression / Assessment and Plan / UC Course  I have reviewed the triage vital signs and the nursing notes.  Pertinent labs & imaging results that were  available during my care of the patient were reviewed by me and considered in my medical decision making (see chart for details).     Fever, abdominal symptoms. UA does actually demonstrate UTI with antibiotics initiated and culture obtained. Covid testing pending and isolation instructions provided.  Return precautions provided. Patient verbalized understanding and agreeable to plan.    Final Clinical Impressions(s) / UC Diagnoses   Final diagnoses:  Acute nonintractable headache, unspecified headache type  Febrile illness  Urinary tract infection without hematuria, site unspecified     Discharge Instructions     Small frequent sips of fluids- Pedialyte, Gatorade, water, broth- to maintain hydration.   Zofran every 8 hours as needed for nausea or vomiting.   I am hoping the medication we give you tonight will help with your headache.  We are repeating your covid test.  Self isolate until covid results are back and negative.  Will notify you by phone of any positive findings. Your negative results will be sent through your MyChart.     If symptoms worsen or do not improve in the next week to return to be seen or to follow up with your PCP.       ED Prescriptions    Medication Sig Dispense Auth. Provider   nitrofurantoin, macrocrystal-monohydrate, (MACROBID) 100 MG capsule Take 1 capsule (100 mg total) by mouth 2 (two) times daily for 5 days. 10 capsule Linus Mako B, NP   ondansetron (ZOFRAN) 4 MG tablet Take 1 tablet (4 mg total) by mouth every 8 (eight) hours as needed for nausea or vomiting. 10 tablet Georgetta Haber, NP     PDMP not reviewed this encounter.   Georgetta Haber,  NP 10/19/19 2210

## 2019-10-18 NOTE — Discharge Instructions (Signed)
Small frequent sips of fluids- Pedialyte, Gatorade, water, broth- to maintain hydration.   Zofran every 8 hours as needed for nausea or vomiting.   I am hoping the medication we give you tonight will help with your headache.  We are repeating your covid test.  Self isolate until covid results are back and negative.  Will notify you by phone of any positive findings. Your negative results will be sent through your MyChart.     If symptoms worsen or do not improve in the next week to return to be seen or to follow up with your PCP.

## 2019-10-18 NOTE — ED Triage Notes (Signed)
Went to [facility] hospital last night, was not seen, but was tested for covid.  Reports a negative test.    Onset Wednesday of no appetite, headache, minimal cough, intermittent vomiting, and fever 101.1

## 2019-10-19 LAB — SARS CORONAVIRUS 2 (TAT 6-24 HRS): SARS Coronavirus 2: NEGATIVE

## 2019-10-21 ENCOUNTER — Ambulatory Visit: Payer: Medicaid Other

## 2019-10-21 LAB — URINE CULTURE: Culture: >=100000 — AB

## 2019-11-26 DIAGNOSIS — H5213 Myopia, bilateral: Secondary | ICD-10-CM | POA: Diagnosis not present

## 2019-12-01 ENCOUNTER — Ambulatory Visit: Payer: Medicaid Other

## 2019-12-03 ENCOUNTER — Ambulatory Visit: Payer: Medicaid Other

## 2019-12-08 DIAGNOSIS — H5203 Hypermetropia, bilateral: Secondary | ICD-10-CM | POA: Diagnosis not present

## 2019-12-08 DIAGNOSIS — H52223 Regular astigmatism, bilateral: Secondary | ICD-10-CM | POA: Diagnosis not present

## 2019-12-09 ENCOUNTER — Ambulatory Visit: Payer: Medicaid Other

## 2019-12-14 ENCOUNTER — Other Ambulatory Visit: Payer: Self-pay

## 2019-12-14 ENCOUNTER — Encounter (HOSPITAL_COMMUNITY): Payer: Self-pay | Admitting: Emergency Medicine

## 2019-12-14 ENCOUNTER — Ambulatory Visit (HOSPITAL_COMMUNITY)
Admission: EM | Admit: 2019-12-14 | Discharge: 2019-12-14 | Disposition: A | Discharge status: Home / Self Care | Payer: Medicaid Other | Attending: Family Medicine | Admitting: Family Medicine

## 2019-12-14 DIAGNOSIS — J0110 Acute frontal sinusitis, unspecified: Secondary | ICD-10-CM | POA: Diagnosis not present

## 2019-12-14 DIAGNOSIS — R059 Cough, unspecified: Secondary | ICD-10-CM

## 2019-12-14 MED ORDER — DEXAMETHASONE SODIUM PHOSPHATE 10 MG/ML IJ SOLN
INTRAMUSCULAR | Status: AC
  Filled 2019-12-14: qty 1

## 2019-12-14 MED ORDER — AMOXICILLIN-POT CLAVULANATE 875-125 MG PO TABS
1.0000 | ORAL_TABLET | Freq: Two times a day (BID) | ORAL | 0 refills | Status: DC
Start: 2019-12-14 — End: 2020-10-06

## 2019-12-14 MED ORDER — DEXAMETHASONE SODIUM PHOSPHATE 10 MG/ML IJ SOLN
10.0000 mg | Freq: Once | INTRAMUSCULAR | Status: AC
  Administered 2019-12-14: 10 mg via INTRAMUSCULAR

## 2019-12-14 NOTE — ED Triage Notes (Signed)
Pt c/o sore throat, cough, SOB, sneezing and nasal congestion onset Sunday. Pt states she has been taking mucinex. She states this morning when she woke up and felt worse and feels sob even talking and she feels very fatigued just getting dressed. Pt states she was covid tested on 11/7 and it was negative.

## 2019-12-14 NOTE — Discharge Instructions (Signed)
Treating you for sinus infection and upper respiratory infection.  Medicines as prescribed Continue the mucinex, flonase and zyrtec daily.  Steroid injection given here  Follow up as needed for continued or worsening symptoms

## 2019-12-14 NOTE — ED Provider Notes (Signed)
[facility]    CSN: [id number] Arrival date & time: 12/14/19  1107      History   Chief Complaint Chief Complaint  Patient presents with   URI    HPI Kerri Mann is a 19 y.o. female.   Patient is a 19 year old female with past medical history of sinusitis.  She presents today with complaints of mild sore throat, nasal congestion, sinus pressure, rhinorrhea, sneezing, coughing and shortness of breath.  Reports her symptoms have worsened over the past 3 to 4 days.  She is taking Mucinex with no relief.  Reporting this morning felt more congested and more short of breath then previously.  She was very fatigued and had low-grade fever.  Had Covid testing done on 11/7 which was negative. She also takes zyrtec and flonase daily.      History reviewed. No pertinent past medical history.  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      Home Medications    Prior to Admission medications   Medication Sig Start Date End Date Taking? Authorizing Provider  cetirizine (ZYRTEC) 10 MG tablet Take 1 tablet (10 mg total) by mouth daily as needed for allergies. Patient taking differently: Take 10 mg by mouth every other day.  06/10/18  Yes Caro Laroche, DO  fluticasone (FLONASE) 50 MCG/ACT nasal spray Place 2 sprays into both nostrils daily. Patient taking differently: Place 2  sprays into both nostrils daily as needed for allergies.  09/22/18  Yes Caro Laroche, DO  ibuprofen (ADVIL) 200 MG tablet Take 200 mg by mouth every 6 (six) hours as needed for moderate pain.   Yes [provider]  loratadine (CLARITIN) 10 MG tablet Take 10 mg by mouth every other day.   Yes [provider]  ondansetron (ZOFRAN) 4 MG tablet Take 1 tablet (4 mg total) by mouth every 8 (eight) hours as needed for nausea or vomiting. 10/18/19  Yes Linus Mako B, NP  amoxicillin-clavulanate (AUGMENTIN) 875-125 MG tablet Take 1 tablet by mouth every 12 (twelve) hours. 12/14/19   Janace Aris, NP    Family History Family History  Problem Relation Age of Onset   Healthy Mother     Social History Social History   Tobacco Use   Smoking status: Never Smoker   Smokeless tobacco: Never Used  Building services engineer Use: Never used  Substance Use Topics   Alcohol use: No   Drug use: No     Allergies   Patient has no known allergies.   Review of Systems Review of Systems   Physical Exam Triage Vital Signs ED Triage Vitals  Enc Vitals Group     BP 12/14/19 1216 124/77     Pulse Rate 12/14/19 1215 75     Resp 12/14/19 1215 18     Temp 12/14/19 1215 98.7 F (37.1 C)  Temp Source 12/14/19 1215 Oral     SpO2 12/14/19 1215 100 %     Weight --      Height --      Head Circumference --      Peak Flow --      Pain Score 12/14/19 1216 7     Pain Loc --      Pain Edu? --      Excl. in GC? --    No data found.  Updated Vital Signs BP 124/77 (BP Location: Left Arm)    Pulse 75    Temp 98.7 F (37.1 C) (Oral)    Resp 18    SpO2 100%   Visual Acuity Right Eye Distance:   Left Eye Distance:   Bilateral Distance:    Right Eye Near:   Left Eye Near:    Bilateral Near:     Physical Exam Vitals and nursing note reviewed.  Constitutional:      General: She is not in acute distress.    Appearance: Normal appearance. She is ill-appearing. She is not  toxic-appearing or diaphoretic.  HENT:     Head: Normocephalic.     Right Ear: Hearing, tympanic membrane, ear canal and external ear normal.     Left Ear: Hearing, tympanic membrane, ear canal and external ear normal.     Nose: Mucosal edema, congestion and rhinorrhea present.     Right Turbinates: Swollen.     Left Turbinates: Swollen.     Right Sinus: Maxillary sinus tenderness and frontal sinus tenderness present.     Left Sinus: Maxillary sinus tenderness and frontal sinus tenderness present.  Eyes:     Conjunctiva/sclera: Conjunctivae normal.  Cardiovascular:     Rate and Rhythm: Normal rate and regular rhythm.     Pulses: Normal pulses.     Heart sounds: Normal heart sounds.  Pulmonary:     Effort: Pulmonary effort is normal.     Comments: Decreased lung sounds in bases.  Harsh cough.  Musculoskeletal:        General: Normal range of motion.     Cervical back: Normal range of motion.  Skin:    General: Skin is warm and dry.     Findings: No rash.  Neurological:     Mental Status: She is alert.  Psychiatric:        Mood and Affect: Mood normal.      UC Treatments / Results  Labs (all labs ordered are listed, but only abnormal results are displayed) Labs Reviewed - No data to display  EKG   Radiology No results found.  Procedures Procedures (including critical care time)  Medications Ordered in UC Medications  dexamethasone (DECADRON) injection 10 mg (10 mg Intramuscular Given 12/14/19 1255)    Initial Impression / Assessment and Plan / UC Course  I have reviewed the triage vital signs and the nursing notes.  Pertinent labs & imaging results that were available during my care of the patient were reviewed by me and considered in my medical decision making (see chart for details).     Acute frontal sinusitis with URI  Medicines as prescribed Continue the Mucinex, Flonase and Zyrtec.  Steroid injection given here for nasal swelling, inflammation and  pain Follow up as needed for continued or worsening symptoms   Final Clinical Impressions(s) / UC Diagnoses   Final diagnoses:  Acute non-recurrent frontal sinusitis  Cough     Discharge Instructions     Treating you for sinus  infection and upper respiratory infection.  Medicines as prescribed Continue the mucinex, flonase and zyrtec daily.  Steroid injection given here  Follow up as needed for continued or worsening symptoms      ED Prescriptions    Medication Sig Dispense Auth. Provider   amoxicillin-clavulanate (AUGMENTIN) 875-125 MG tablet Take 1 tablet by mouth every 12 (twelve) hours. 14 tablet Leonardo Makris A, NP     PDMP not reviewed this encounter.   Janace Aris, NP 12/14/19 1311

## 2019-12-23 ENCOUNTER — Ambulatory Visit (INDEPENDENT_AMBULATORY_CARE_PROVIDER_SITE_OTHER): Payer: Medicaid Other

## 2019-12-23 ENCOUNTER — Other Ambulatory Visit: Payer: Self-pay

## 2019-12-23 DIAGNOSIS — Z3042 Encounter for surveillance of injectable contraceptive: Secondary | ICD-10-CM | POA: Diagnosis not present

## 2019-12-23 LAB — POCT URINE PREGNANCY: Preg Test, Ur: NEGATIVE

## 2019-12-23 MED ORDER — MEDROXYPROGESTERONE ACETATE 150 MG/ML IM SUSP
150.0000 mg | Freq: Once | INTRAMUSCULAR | Status: AC
  Administered 2019-12-23: 150 mg via INTRAMUSCULAR

## 2019-12-23 NOTE — Progress Notes (Signed)
Patient here today for Depo Provera injection and is not within her dates.    Urine pregnancy obtained and negative.   Patient advised to use back up protection for one week and one week repeat pregnancy test.  Last contraceptive appt was 06/18/2019.  Depo given in RUOQ today.Site unremarkable & patient tolerated injection.    Next injection due 03/09/2020-03/23/2020.Reminder card given.

## 2020-01-26 ENCOUNTER — Ambulatory Visit: Payer: Medicaid Other | Admitting: Family Medicine

## 2020-01-27 ENCOUNTER — Ambulatory Visit: Payer: Medicaid Other | Admitting: Family Medicine

## 2020-02-01 ENCOUNTER — Encounter: Payer: Self-pay | Admitting: Family Medicine

## 2020-02-01 ENCOUNTER — Ambulatory Visit (INDEPENDENT_AMBULATORY_CARE_PROVIDER_SITE_OTHER): Payer: Medicaid Other | Admitting: Family Medicine

## 2020-02-01 ENCOUNTER — Other Ambulatory Visit: Payer: Self-pay

## 2020-02-01 VITALS — BP 112/62 | HR 92 | Wt 164.2 lb

## 2020-02-01 DIAGNOSIS — B349 Viral infection, unspecified: Secondary | ICD-10-CM | POA: Insufficient documentation

## 2020-02-01 DIAGNOSIS — R112 Nausea with vomiting, unspecified: Secondary | ICD-10-CM

## 2020-02-01 MED ORDER — ONDANSETRON 4 MG PO TBDP: 4.0000 mg | ORAL_TABLET | Freq: Three times a day (TID) | ORAL | 0 refills | Status: DC | PRN

## 2020-02-01 NOTE — Progress Notes (Signed)
   SUBJECTIVE:   CHIEF COMPLAINT / HPI:   Chief Complaint  Patient presents with  . Migraine     Kerri Mann is a 19 y.o. female here for headache. Patient states she missed work 2/2 to headache and vomiting. Pt requests work note. Of note, pt's cousin who she shared a bed with during Christmas has vomiting and diarrhea.   Pt is not influenza or COVID vaccinated.     Vomiting  She has been drinking clear liquids and continues to urinate regularly.  She had 3 episodes of nonbloody emesis yesterday and one today. No fever, dysuria, diarrhea, sore throat or abdominal pain. Endorses fatigue, myalgias. Her cousin has vomiting, diarrhea.    Headache: Onset: 01/30/20 Location: entire head, bilateral  Quality: sharp, throbbing  Frequency: intermittent  Precipitating factors: better with exerdrin, worse with movement of her head Prior treatment: Ibuprofen, Excedrin   Associated Symptoms Nausea/vomiting: yes  Photophobia/phonophobia: yes  Tearing of eyes: yes  Sinus pain/pressure: no  Family hx migraine: no  Personal stressors: no  Relation to menstrual cycle: no   Red Flags Fever: no  Neck pain/stiffness: yes  Vision/speech/swallow/hearing difficulty: no  Focal weakness/numbness: no  Altered mental status: no  Trauma: no  New type of headache: no  Anticoagulant use: no  H/o cancer/HIV/Pregnancy: no, Patient on [place].   PERTINENT  PMH / PSH: reviewed and updated as appropriate   OBJECTIVE:   BP 112/62   Pulse 92   Wt 164 lb 3.2 oz (74.5 kg)   SpO2 98%   BMI 29.09 kg/m   Temp: 98.9 F  GEN: well developed female, in no acute distress  HENT: dry mucous membranes, no oral erythema or lesions, no sinus tenderness  NECK: Normal range of motion, no hypertrophic parapinal muscles or midline C spine tenderness CV: regular rate and rhythm, no murmurs appreciated  RESP: no increased work of breathing, clear to ascultation bilaterally ABD: Bowel sounds present. Soft,  Nontender, Nondistended.  SKIN: warm, dry    ASSESSMENT/PLAN:   Viral illness HEADACHE VOMITING Suspect headache is 2/2 to mild dehydration as mucus membranes dry with hx of vomiting and family with similar sx. Zofran for vomiting and ibuprofen for headache 600 mg TID PRN. Advised pt to get COVID/influenza tested. Work note provided.      Katha Cabal, DO PGY-2, [facility] 02/01/2020

## 2020-02-01 NOTE — Patient Instructions (Signed)
It is unfortunate that you are unwell.    -  For headache: take 600 mg Ibuprofen every 8 hours as needed   - For vomiting: Stop by the pharmacy to pick up your medication. Take Zofran as needed.  - Increase fluid intake as it is important to stay hydrated.  - As discussed, get COVID tested.  If positive you will need to quarantine for 10 days.    If you have questions or concerns please do not hesitate to call at [phone].  Dr. Katherina Right [facility]

## 2020-02-01 NOTE — Assessment & Plan Note (Addendum)
HEADACHE VOMITING Suspect headache is 2/2 to mild dehydration as mucus membranes dry with hx of vomiting and family with similar sx. Zofran for vomiting and ibuprofen for headache 600 mg TID PRN. Advised pt to get COVID/influenza testing. Work note provided.

## 2020-02-02 DIAGNOSIS — Z20822 Contact with and (suspected) exposure to covid-19: Secondary | ICD-10-CM | POA: Diagnosis not present

## 2020-02-02 DIAGNOSIS — B349 Viral infection, unspecified: Secondary | ICD-10-CM | POA: Diagnosis not present

## 2020-03-08 DIAGNOSIS — Z03818 Encounter for observation for suspected exposure to other biological agents ruled out: Secondary | ICD-10-CM | POA: Diagnosis not present

## 2020-03-08 DIAGNOSIS — Z20822 Contact with and (suspected) exposure to covid-19: Secondary | ICD-10-CM | POA: Diagnosis not present

## 2020-03-10 ENCOUNTER — Ambulatory Visit (INDEPENDENT_AMBULATORY_CARE_PROVIDER_SITE_OTHER): Payer: Medicaid Other | Admitting: Family Medicine

## 2020-03-10 DIAGNOSIS — Z91199 Patient's noncompliance with other medical treatment and regimen due to unspecified reason: Secondary | ICD-10-CM | POA: Insufficient documentation

## 2020-03-10 DIAGNOSIS — Z5329 Procedure and treatment not carried out because of patient's decision for other reasons: Secondary | ICD-10-CM

## 2020-03-10 NOTE — Progress Notes (Signed)
Patient was a no-show to her appointment today 03/10/2020.  Patient has several new no-shows on 10/21/2019, 12/01/2019, 12/03/2019, 12/09/2019, and 01/27/2020.  Patient was sent a letter stating no-show/late cancellation policy.  Patient was told that 1 more missed appointment could lead to dismissal from the clinic.   Peggyann Shoals, DO [facility], PGY-3 03/10/2020 3:10 PM

## 2020-03-20 ENCOUNTER — Other Ambulatory Visit: Payer: Self-pay

## 2020-03-20 ENCOUNTER — Ambulatory Visit (INDEPENDENT_AMBULATORY_CARE_PROVIDER_SITE_OTHER): Payer: Medicaid Other

## 2020-03-20 DIAGNOSIS — Z3042 Encounter for surveillance of injectable contraceptive: Secondary | ICD-10-CM | POA: Diagnosis not present

## 2020-03-20 MED ORDER — MEDROXYPROGESTERONE ACETATE 150 MG/ML IM SUSP
150.0000 mg | Freq: Once | INTRAMUSCULAR | Status: AC
  Administered 2020-03-20: 150 mg via INTRAMUSCULAR

## 2020-03-20 NOTE — Progress Notes (Signed)
Patient here today for Depo Provera injection and is within her dates.    Last contraceptive appt was 06/18/19  Depo given in LUOQ today.  Site unremarkable & patient tolerated injection.    Next injection due 5/2-5/16.  Reminder card given. Instructed patient to schedule next depo with provider, as she will be due for annual contraceptive appointment. Patient also reports concerns with weight gain and break through bleeding.   Veronda Prude, RN

## 2020-04-14 ENCOUNTER — Ambulatory Visit: Payer: Medicaid Other

## 2020-04-14 NOTE — Progress Notes (Deleted)
    SUBJECTIVE:   CHIEF COMPLAINT / HPI: Stomach problems   She did have right lower quadrant abdominal pain back in 07/2019.  CT abdomen unremarkable at that time.  PERTINENT  PMH / PSH: ***  OBJECTIVE:   There were no vitals taken for this visit.  ***  ASSESSMENT/PLAN:   No problem-specific Assessment & Plan notes found for this encounter.     Allayne Stack, DO [facility]   {    This will disappear when note is signed, click to select method of visit    :1}

## 2020-05-02 DIAGNOSIS — Z20822 Contact with and (suspected) exposure to covid-19: Secondary | ICD-10-CM | POA: Diagnosis not present

## 2020-05-02 DIAGNOSIS — J069 Acute upper respiratory infection, unspecified: Secondary | ICD-10-CM | POA: Diagnosis not present

## 2020-06-23 ENCOUNTER — Ambulatory Visit (INDEPENDENT_AMBULATORY_CARE_PROVIDER_SITE_OTHER): Payer: Medicaid Other | Admitting: Family Medicine

## 2020-06-23 ENCOUNTER — Other Ambulatory Visit: Payer: Self-pay

## 2020-06-23 ENCOUNTER — Encounter: Payer: Self-pay | Admitting: Family Medicine

## 2020-06-23 VITALS — BP 114/69 | HR 83 | Wt 148.4 lb

## 2020-06-23 DIAGNOSIS — Z3042 Encounter for surveillance of injectable contraceptive: Secondary | ICD-10-CM

## 2020-06-23 DIAGNOSIS — R0981 Nasal congestion: Secondary | ICD-10-CM

## 2020-06-23 LAB — POCT URINE PREGNANCY: Preg Test, Ur: NEGATIVE

## 2020-06-23 MED ORDER — MEDROXYPROGESTERONE ACETATE 150 MG/ML IM SUSY
150.0000 mg | PREFILLED_SYRINGE | Freq: Once | INTRAMUSCULAR | Status: AC
  Administered 2020-06-23: 150 mg via INTRAMUSCULAR

## 2020-06-23 NOTE — Progress Notes (Signed)
    SUBJECTIVE:   CHIEF COMPLAINT / HPI:   Contraception Management: patient has been on Depo shot for contraception. She wants to continue depo for now but feels she is gaining weight from the shot. She is interested in learning about other forms of birth control. Discussion was had regarding patch, Nuvaring, IUD (not a favorite for the patient), and Nexplanon. She would like to reconsider these options in the future.   Stuffy nose: patient reports that since having her wisdom teeth removed in April 2022 she has had a stuffy nose that she feels is even making her voice sound differently. She has a tough time breathing at night due to stuffiness in her nose. She has been using over the counter medications to relieve her symptoms without change. She denies fevers, body aches, chills, runny nose, itchy eyes, sneezing, sore throat, difficulty swallowing, cough, chest pain, shortness of breath, nausea, vomiting, abdominal pain, diarrhea, and rash.   PERTINENT  PMH / PSH:  Patient Active Problem List   Diagnosis Date Noted  . Stuffy nose 06/24/2020  . No-show for appointment 03/10/2020  . Viral illness 02/01/2020  . Sinusitis 09/27/2019  . RLQ abdominal pain 07/20/2019  . History of pyelonephritis 06/22/2019  . Syncope 03/17/2019  . Eustachian tube dysfunction 09/22/2018  . 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 Depression 07/11/2017  . Headache 05/02/2017  . Contraception management 02/02/2015  . 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 Irregular menstrual cycle 06/29/2014    OBJECTIVE:   BP 114/69   Pulse 83   Wt 148 lb 6.4 oz (67.3 kg)   SpO2 100%   BMI 26.29 kg/m    Physical exam: General: well appearing patient, no acute distress ENT: left TM with small effusion, Right TM normal in appearance, no drainage or erythema; bilateral nares with pale turbinates and narrow nasal passages, no nasal drainage appreciated Respiratory: CTA bilaterally, comfortable work of breathing on  RA Cardio: RRR S1S2 present, no murmurs appreciated   ASSESSMENT/PLAN:   Stuffy nose No other sick symptoms -Will try Flonase nasal spray applied to each nostril properly -Follow up as needed  Contraception management Due for Depo shot, Urine pregnancy negative.  -Depo applied -Patient to follow up in 3 months for repeat Depo or other form of birth control     Dollene Cleveland, DO [facility]

## 2020-06-23 NOTE — Patient Instructions (Signed)
.  Thank you for coming in to see [place] today! Please see below to review our plan for today's visit:  1. Use flonase by NOT taking a big breath in through your nose. Aim the nozzle towards your ear. Do not blow your nose for 30 minutes after.  2. Consider other methods of birth control.  3. Follow up within 3 months for [facility] or sooner for stuffy nose.   Please call the clinic at [phone] if your symptoms worsen or you have any concerns. It was our pleasure to serve you!   Dr. Peggyann Shoals [facility]

## 2020-06-24 DIAGNOSIS — R0981 Nasal congestion: Secondary | ICD-10-CM | POA: Insufficient documentation

## 2020-06-24 NOTE — Assessment & Plan Note (Signed)
Due for Depo shot, Urine pregnancy negative.  -Depo applied -Patient to follow up in 3 months for repeat Depo or other form of birth control

## 2020-06-24 NOTE — Assessment & Plan Note (Signed)
No other sick symptoms -Will try Flonase nasal spray applied to each nostril properly -Follow up as needed

## 2020-07-17 ENCOUNTER — Telehealth: Payer: Self-pay | Admitting: Family Medicine

## 2020-07-17 ENCOUNTER — Other Ambulatory Visit: Payer: Self-pay | Admitting: Family Medicine

## 2020-07-17 DIAGNOSIS — R0981 Nasal congestion: Secondary | ICD-10-CM

## 2020-07-17 DIAGNOSIS — R499 Unspecified voice and resonance disorder: Secondary | ICD-10-CM | POA: Insufficient documentation

## 2020-07-17 NOTE — Progress Notes (Signed)
Patient was seen Jun 25, 2020 with concerns for nasal stuffiness.  "Stuffy nose: patient reports that since having her wisdom teeth removed in April 2022 she has had a stuffy nose that she feels is even making her voice sound differently. She has a tough time breathing at night due to stuffiness in her nose. She has been using over the counter medications to relieve her symptoms without change. She denies fevers, body aches, chills, runny nose, itchy eyes, sneezing, sore throat, difficulty swallowing, cough, chest pain, shortness of breath, nausea, vomiting, abdominal pain, diarrhea, and rash."  Patient had boggy nasal turbinates bilaterally without any other abnormal findings.  She has been using Flonase without any relief.  Patient is requesting referral to ENT.  I agree with this referral, will place referral for ENT.   Peggyann Shoals, DO [facility], PGY-3 07/17/2020 5:41 PM

## 2020-07-17 NOTE — Telephone Encounter (Signed)
Patient is calling stating she is no better and doctor said if she wasn't feeling any better that a referral for ENT could be placed and patient is calling to get that referral placed. Please advise. Thanks!

## 2020-07-17 NOTE — Telephone Encounter (Signed)
Will forward to MD to place the referral.  [facility]

## 2020-08-23 ENCOUNTER — Ambulatory Visit: Payer: Medicaid Other | Admitting: Family Medicine

## 2020-10-02 DIAGNOSIS — J342 Deviated nasal septum: Secondary | ICD-10-CM | POA: Diagnosis not present

## 2020-10-02 DIAGNOSIS — J343 Hypertrophy of nasal turbinates: Secondary | ICD-10-CM | POA: Diagnosis not present

## 2020-10-04 ENCOUNTER — Ambulatory Visit (INDEPENDENT_AMBULATORY_CARE_PROVIDER_SITE_OTHER): Payer: Medicaid Other

## 2020-10-04 ENCOUNTER — Ambulatory Visit (INDEPENDENT_AMBULATORY_CARE_PROVIDER_SITE_OTHER): Payer: Medicaid Other | Admitting: Family Medicine

## 2020-10-04 ENCOUNTER — Encounter: Payer: Self-pay | Admitting: Family Medicine

## 2020-10-04 ENCOUNTER — Other Ambulatory Visit (HOSPITAL_COMMUNITY)
Admission: RE | Admit: 2020-10-04 | Discharge: 2020-10-04 | Disposition: A | Discharge status: Home / Self Care | Payer: Medicaid Other | Source: Ambulatory Visit | Attending: Family Medicine | Admitting: Family Medicine

## 2020-10-04 ENCOUNTER — Other Ambulatory Visit: Payer: Self-pay

## 2020-10-04 VITALS — BP 111/78 | HR 79 | Ht 63.0 in | Wt 147.6 lb

## 2020-10-04 DIAGNOSIS — L299 Pruritus, unspecified: Secondary | ICD-10-CM

## 2020-10-04 DIAGNOSIS — Z23 Encounter for immunization: Secondary | ICD-10-CM

## 2020-10-04 DIAGNOSIS — Z124 Encounter for screening for malignant neoplasm of cervix: Secondary | ICD-10-CM

## 2020-10-04 DIAGNOSIS — A749 Chlamydial infection, unspecified: Secondary | ICD-10-CM

## 2020-10-04 DIAGNOSIS — J302 Other seasonal allergic rhinitis: Secondary | ICD-10-CM

## 2020-10-04 DIAGNOSIS — Z1159 Encounter for screening for other viral diseases: Secondary | ICD-10-CM | POA: Diagnosis not present

## 2020-10-04 DIAGNOSIS — Z789 Other specified health status: Secondary | ICD-10-CM | POA: Diagnosis not present

## 2020-10-04 DIAGNOSIS — Z113 Encounter for screening for infections with a predominantly sexual mode of transmission: Secondary | ICD-10-CM | POA: Diagnosis not present

## 2020-10-04 DIAGNOSIS — Z3202 Encounter for pregnancy test, result negative: Secondary | ICD-10-CM

## 2020-10-04 LAB — POCT URINE PREGNANCY: Preg Test, Ur: NEGATIVE

## 2020-10-04 MED ORDER — MEDROXYPROGESTERONE ACETATE 150 MG/ML IM SUSP
150.0000 mg | Freq: Once | INTRAMUSCULAR | Status: AC
  Administered 2020-10-04: 150 mg via INTRAMUSCULAR

## 2020-10-04 MED ORDER — CETIRIZINE HCL 10 MG PO TABS: 10.0000 mg | ORAL_TABLET | Freq: Every day | ORAL | 0 refills | Status: DC | PRN

## 2020-10-04 NOTE — Progress Notes (Signed)
    SUBJECTIVE:   CHIEF COMPLAINT / HPI:   Chief Complaint  Patient presents with   Annual Exam   Std testing     Kerri Mann is a 20 y.o. female here for annual visit. Has no concerns today. She is sexually active. Uses Depot for birth control. She is experiencing weight gain and would like to switch to something else.    Denies tobacco use and illicit drug use. Drinks alcohol once a month.    PERTINENT  PMH / PSH: reviewed and updated as appropriate   OBJECTIVE:   BP 111/78   Pulse 79   Ht 5\' 3"  (1.6 m)   Wt 147 lb 9.6 oz (67 kg)   LMP  (LMP Unknown)   SpO2 100%   BMI 26.15 kg/m   GEN: pleasant well appearing female, in no acute distress  CV: regular rate and rhythm, no murmurs appreciated  RESP: no increased work of breathing, clear to ascultation bilaterally ABD: Bowel sounds present. Soft, non-tender, non-distended.  MSK: no edema, or cyanosis noted Pelvic exam: normal external genitalia, vulva, vagina, cervix, uterus and adnexa, PAP: Pap smear done today, chaperoned by CMA SKIN: warm, dry    ASSESSMENT/PLAN:   No problem-specific Assessment & Plan notes found for this encounter.   Cervical Cancer Screening First PAP performed today. STI testing obtained with PAP.   Contraception Management The patient denies history of VTE. They have no history of hypertension or migraine with aura. They have no other contraindications for estrogen therapy. Education given regarding options for contraception, including barrier methods, injectable contraception, IUD placement, oral contraceptives. Patient advised to continue Depo today as she is unsure which BC she wants to switch to.  - Urine pregnancy test: negative  - Depo Provera injection given    , DO PGY-3, [facility] 10/04/2020

## 2020-10-05 ENCOUNTER — Encounter: Payer: Self-pay | Admitting: Family Medicine

## 2020-10-05 LAB — CYTOLOGY - PAP
Chlamydia: POSITIVE — AB
Comment: NEGATIVE
Comment: NEGATIVE
Comment: NORMAL
Diagnosis: NEGATIVE
Neisseria Gonorrhea: NEGATIVE
Trichomonas: NEGATIVE

## 2020-10-05 MED ORDER — AZITHROMYCIN 500 MG PO TABS: 1000.0000 mg | ORAL_TABLET | Freq: Once | ORAL | 0 refills | Status: AC

## 2020-10-06 ENCOUNTER — Telehealth: Payer: Self-pay

## 2020-10-06 NOTE — Telephone Encounter (Signed)
Patient calls nurse line requesting to speak with Dr. Rachael Darby, as she has further questions regarding her test results.   Reports that partner tested positive for trichomonas , however, negative for chlamydia.   Please return call to patient to further discuss.   Veronda Prude, RN

## 2020-10-06 NOTE — Telephone Encounter (Signed)
Called patient at her request.  Patient had additional questions about her partner's test results.  States that her partner tested positive for trichomoniasis.  Her test for check however was negative.  His test for chlamydia was negative.  However her test for chlamydia was positive.  Answered all questions about medication.  Advised patient to call if she starts having new symptoms.  Katha Cabal, DO PGY-3, [facility] 10/06/2020

## 2020-10-26 ENCOUNTER — Other Ambulatory Visit: Payer: Self-pay

## 2020-10-26 ENCOUNTER — Ambulatory Visit: Payer: Medicaid Other

## 2020-10-26 ENCOUNTER — Ambulatory Visit (INDEPENDENT_AMBULATORY_CARE_PROVIDER_SITE_OTHER): Payer: Medicaid Other | Admitting: Family Medicine

## 2020-10-26 VITALS — BP 110/70 | HR 71 | Ht 63.0 in | Wt 148.8 lb

## 2020-10-26 DIAGNOSIS — Z0000 Encounter for general adult medical examination without abnormal findings: Secondary | ICD-10-CM

## 2020-10-26 DIAGNOSIS — Z113 Encounter for screening for infections with a predominantly sexual mode of transmission: Secondary | ICD-10-CM | POA: Diagnosis not present

## 2020-10-26 DIAGNOSIS — Z23 Encounter for immunization: Secondary | ICD-10-CM | POA: Diagnosis not present

## 2020-10-26 DIAGNOSIS — G44209 Tension-type headache, unspecified, not intractable: Secondary | ICD-10-CM

## 2020-10-26 DIAGNOSIS — A749 Chlamydial infection, unspecified: Secondary | ICD-10-CM | POA: Diagnosis not present

## 2020-10-26 NOTE — Assessment & Plan Note (Signed)
Hx of chlamydia infection, adequately treated. Denies sexual exposure to ex-partner. Explained to pt that it would be too soon to retest pt as it could come up as positive despite treatment. Recommended pt waits another week or two before retesting. Pt expressed understanding. Recommended safe sex practice and wearing condoms to prevent STDs.

## 2020-10-26 NOTE — Progress Notes (Addendum)
     SUBJECTIVE:   CHIEF COMPLAINT / HPI:   Kerri Mann is a 20 y.o. female presents for STD check  STD check Pt treated for chlamydia 3 weeks ago. She was told to come in 2 weeks to get retested for chlamydia.   Headache Onset: suddenly, 1 week ago Location: frontal  Quality: throbbing Frequency: 3 headaches in the last week  Precipitating factors: none  Prior treatment: ibuprofen  Last headache was last night.   Associated Symptoms Felt dizzy Nausea/vomiting: no  Photophobia/phonophobia: no  Tearing of eyes: no  Sinus pain/pressure: no  Family hx migraine: no  Personal stressors: no  Relation to menstrual cycle: no   Red Flags Fever: no  Neck pain/stiffness: no  Vision/speech/swallow/hearing difficulty: no  Focal weakness/numbness: no  Altered mental status: no  Trauma: no  New type of headache: yes  Anticoagulant use: no  H/o cancer/HIV/Pregnancy: no   Feels tired all the time   [employer] Visit from 10/04/2020 in [facility]  PHQ-9 Total Score 2         PERTINENT  PMH / PSH: chlamydia   OBJECTIVE:   BP 110/70   Pulse 71   Ht 5\' 3"  (1.6 m)   Wt 148 lb 12.8 oz (67.5 kg)   LMP  (LMP Unknown)   SpO2 98%   BMI 26.36 kg/m    General: Alert, no acute distress Cardio: well perfused  Pulm: normal work of breathing Neuro: Cranial nerve exam normal  ASSESSMENT/PLAN:   Tension headache Likely tension headache. No red flags for headache. Recommended tylenol and ibuprofen for the pain, eating 3 meals a day and staying adequately hydrated. Follow up with PCP if persistent symptoms. ER precautions given to pt.  Chlamydia infection Hx of chlamydia infection, adequately treated. Denies sexual exposure to ex-partner. Explained to pt that it would be too soon to retest pt as it could come up as positive despite treatment. Recommended pt waits another week or two before retesting. Pt expressed understanding. Recommended safe  sex practice and wearing condoms to prevent STDs.  Screening for STDs (sexually transmitted diseases) Obtained HIV, Hep C and RPR labs today.  Health maintenance examination Pt received flu vaccine today.    , MD PGY-3 [facility]

## 2020-10-26 NOTE — Assessment & Plan Note (Signed)
Obtained HIV, Hep C and RPR labs today.

## 2020-10-26 NOTE — Assessment & Plan Note (Signed)
Likely tension headache. No red flags for headache. Recommended tylenol and ibuprofen for the pain, eating 3 meals a day and staying adequately hydrated. Follow up with PCP if persistent symptoms. ER precautions given to pt.

## 2020-10-26 NOTE — Assessment & Plan Note (Signed)
Pt received flu vaccine today. 

## 2020-10-26 NOTE — Patient Instructions (Signed)
Thank you for coming to see me today. It was a pleasure. Today we discussed STD testing. It is too early to test you for chlamydia. Please come back again in 1-2 weeks and we can do it then.  We will get some labs today.  If they are abnormal or we need to do something about them, I will call you.  If they are normal, I will send you a message on MyChart (if it is active) or a letter in the mail.  If you don't hear from [place] in 2 weeks, please call the office at the number below.     For your headaches-I think this is a tension headache. Take tylenol, motrin for the pain. Please ensure you eat 3 meals a day, drink fluids etc  Please follow-up with PCP if persistent symptoms.   If you have any questions or concerns, please do not hesitate to call the office at [phone].  Best wishes,   Dr Allena Katz

## 2020-11-15 ENCOUNTER — Ambulatory Visit: Payer: Medicaid Other

## 2020-11-16 ENCOUNTER — Ambulatory Visit: Payer: Medicaid Other

## 2020-11-16 DIAGNOSIS — J343 Hypertrophy of nasal turbinates: Secondary | ICD-10-CM | POA: Diagnosis not present

## 2020-11-16 DIAGNOSIS — J342 Deviated nasal septum: Secondary | ICD-10-CM | POA: Diagnosis not present

## 2020-11-16 DIAGNOSIS — J3489 Other specified disorders of nose and nasal sinuses: Secondary | ICD-10-CM | POA: Diagnosis not present

## 2020-11-16 DIAGNOSIS — R438 Other disturbances of smell and taste: Secondary | ICD-10-CM | POA: Diagnosis not present

## 2020-11-16 DIAGNOSIS — R0981 Nasal congestion: Secondary | ICD-10-CM | POA: Diagnosis not present

## 2020-11-16 NOTE — Progress Notes (Deleted)
    SUBJECTIVE:   CHIEF COMPLAINT / HPI:   Patient was found to have chlamydia infection on routine Pap 8/31.  This was treated with 1 dose of azithromycin.  She return to clinic 9/22 but it was too soon to be retested.  PERTINENT  PMH / PSH: ***  OBJECTIVE:   There were no vitals taken for this visit.  General: ***, NAD CV: RRR, no murmurs*** Pulm: CTAB, no wheezes or rales  ASSESSMENT/PLAN:   No problem-specific Assessment & Plan notes found for this encounter.   Per CDC guidelines, retesting of chlamydia is recommended about 3 months after treatment.  Littie Deeds, MD [facility]   {    This will disappear when note is signed, click to select method of visit    :1}

## 2020-11-17 ENCOUNTER — Ambulatory Visit: Payer: Medicaid Other | Admitting: Family Medicine

## 2020-12-12 ENCOUNTER — Ambulatory Visit: Payer: Medicaid Other | Admitting: Family Medicine

## 2020-12-26 DIAGNOSIS — J069 Acute upper respiratory infection, unspecified: Secondary | ICD-10-CM | POA: Diagnosis not present

## 2021-01-04 ENCOUNTER — Ambulatory Visit: Payer: Medicaid Other | Admitting: Family Medicine

## 2021-01-11 ENCOUNTER — Ambulatory Visit: Payer: Medicaid Other | Admitting: Family Medicine

## 2021-01-11 NOTE — Progress Notes (Deleted)
   SUBJECTIVE:   CHIEF COMPLAINT / HPI:   No chief complaint on file.    Kerri Mann is a 20 y.o. female here for ***   Pt reports ***    PERTINENT  PMH / PSH: reviewed and updated as appropriate   OBJECTIVE:   There were no vitals taken for this visit.  ***  ASSESSMENT/PLAN:   No problem-specific Assessment & Plan notes found for this encounter.     Katha Cabal, DO PGY-3, [facility] 01/11/2021      {    This will disappear when note is signed, click to select method of visit    :1}

## 2021-02-06 ENCOUNTER — Ambulatory Visit: Payer: Medicaid Other | Admitting: Student

## 2021-02-06 NOTE — Progress Notes (Deleted)
° ° °  SUBJECTIVE:   CHIEF COMPLAINT / HPI:   Fatigue  Birth Control Previously on depo and wants to switch due to weight gain. Interested in ***  PERTINENT  PMH / [place]: ***  OBJECTIVE:   There were no vitals taken for this visit.  ***  ASSESSMENT/PLAN:   No problem-specific Assessment & Plan notes found for this encounter.     Gerrit Heck, MD [place]

## 2021-02-06 NOTE — Patient Instructions (Incomplete)
It was great to see you! Thank you for allowing me to participate in your care!   I recommend that you always bring your medications to each appointment as this makes it easy to ensure we are on the correct medications and helps [place] not miss when refills are needed.  Our plans for today:  - *** -   We are checking some labs today, I will call you if they are abnormal will send you a MyChart message or a letter if they are normal.  If you do not hear about your labs in the next 2 weeks please let us know.***  Take care and seek immediate care sooner if you develop any concerns. Please remember to show up 15 minutes before your scheduled appointment time!  Levin Erp, MD [facility]

## 2021-02-09 ENCOUNTER — Encounter: Payer: Self-pay | Admitting: Student

## 2021-02-09 ENCOUNTER — Other Ambulatory Visit: Payer: Self-pay

## 2021-02-09 ENCOUNTER — Ambulatory Visit (INDEPENDENT_AMBULATORY_CARE_PROVIDER_SITE_OTHER): Payer: Medicaid Other | Admitting: Student

## 2021-02-09 ENCOUNTER — Ambulatory Visit (INDEPENDENT_AMBULATORY_CARE_PROVIDER_SITE_OTHER): Payer: Medicaid Other

## 2021-02-09 VITALS — BP 120/73 | HR 94 | Ht 63.0 in | Wt 155.0 lb

## 2021-02-09 DIAGNOSIS — Z23 Encounter for immunization: Secondary | ICD-10-CM

## 2021-02-09 DIAGNOSIS — R5383 Other fatigue: Secondary | ICD-10-CM | POA: Insufficient documentation

## 2021-02-09 DIAGNOSIS — Z3042 Encounter for surveillance of injectable contraceptive: Secondary | ICD-10-CM

## 2021-02-09 DIAGNOSIS — Z3009 Encounter for other general counseling and advice on contraception: Secondary | ICD-10-CM

## 2021-02-09 DIAGNOSIS — Z1159 Encounter for screening for other viral diseases: Secondary | ICD-10-CM

## 2021-02-09 LAB — POCT URINE PREGNANCY: Preg Test, Ur: NEGATIVE

## 2021-02-09 MED ORDER — MEDROXYPROGESTERONE ACETATE 150 MG/ML IM SUSP
150.0000 mg | Freq: Once | INTRAMUSCULAR | Status: AC
  Administered 2021-02-09: 150 mg via INTRAMUSCULAR

## 2021-02-09 NOTE — Patient Instructions (Addendum)
It was great to see you! Thank you for allowing me to participate in your care!  I recommend that you always bring your medications to each appointment as this makes it easy to ensure we are on the correct medications and helps [place] not miss when refills are needed.  Our plans for today:  - Continue to stay hydrated, well-rested and work on stress reduction techniques (meditation, deep breathing, stretching or yoga) - Eat frequent, small meals throughout the day - Use Flonase daily and a humidifer with honey to help your cough. I have sent a prescription to your pharmacyfor Atrovent (nasal spray) that may help in reducing your symptoms. - You received your Depo-Provera shot today that will be effective for 3 months. Continue to use condoms to prevent STIs, as Depo is only effective at preventing pregnancy. Use a backup contraceptive method for the next 7 days.  We are checking some labs today, I will call you if they are abnormal will send you a MyChart message or a letter if they are normal.  If you do not hear about your labs in the next 2 weeks please let us know.  Take care and seek immediate care sooner if you develop any concerns.   Dr. Darral Dash, DO [facility] [phone]

## 2021-02-09 NOTE — Progress Notes (Signed)
° ° °  SUBJECTIVE:   CHIEF COMPLAINT / HPI:   Fatigue Patient has been having fatigue for the past 6 weeks, with loss of appetite eating 1 meal a day ever since she has had a dry cough with congestion.  She says she can sleep for 12 to 14 hours and still wake up feeling tired.  No recent fever, chills, weight loss.  No palpitations.  Has had recent weight gain, which is bothersome to her.  Remote history of viral illness Patient had an upper respiratory infection and was seen at an urgent care last month.  She was prescribed a cough medication which did not help.  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  She has been using daily intranasal steroid spray with daily oral antihistamine with minimal improvement in symptoms.  ENT will be doing a further work-up with RAST allergy testing and maxillofacial CT scan, with possible septoplasty and turbinate reduction.  She is vaccinated against COVID, due for second booster which she is agreeable to receive today, and received her annual flu vaccination.  Contraceptive management She was previously on Depo-Provera, was due 12/2020 but did not receive it.  She is not desiring pregnancy and is not currently sexually active, but after discussion said she would like to go ahead and get a Depo shot today.  PERTINENT  PMH / PSH: Chronic nasal congestion, history of pyelonephritis, history of STD  OBJECTIVE:   BP 120/73    Pulse 94    Ht 5\' 3"  (1.6 m)    Wt 155 lb (70.3 kg)    SpO2 100%    BMI 27.46 kg/m   General: Well-appearing female, appears stated age, in no distress HEENT: Pupils PERRLA.  No conjunctival pallor.  Audible congestion of upper respiratory tract.  Mild tonsillar erythema, no hypertrophy or exudate.  Mucous membranes moist.  Bilateral tympanic membranes fluid, dullness, erythema.  Cone of light visualized  bilaterally. Respiratory: Lung sounds clear in all fields.  No wheezing or crackles. CV: Regular rate and rhythm.  No murmur. Skin: Warm, dry, well perfused.  No pallor or jaundice  ASSESSMENT/PLAN:   Contraception management Received Depo shot today. Urine pregnancy test negative prior to administration. - Educated patient on data associated with contraception and weight gain, which is minimal as well as irregular menstruation as an adverse effect of hormonal contraception -Follow up in 3 months for Depo  Fatigue Patient currently in [employer] school and notes this as a stressor. Has a history of depression but denies any current mood symptoms like hopelessness, SI, guilt, etc. that she feels contribute to her fatigue. No concern for malignancy and any red flag symptoms like weight loss or fevers. More likely secondary to post viral syndrome vs. life stressors. Could potentially be a B12 deficiency, anemia or hypothyroidism. No documented history or signs on physical exam of anemia including tachycardia or pallor. No goiter.  - Encouraged patient to eat multiple small meals through the day to maintain energy levels, instead of one meal a day - Encouraged to use humidifier with intranasal steroid and antihistamine; follow up with ENT - Obtained labs today: B12, CBC, TSH. Will follow up on these.    , DO [facility]

## 2021-02-09 NOTE — Assessment & Plan Note (Signed)
Received Depo shot today. Urine pregnancy test negative prior to administration. - Educated patient on data associated with contraception and weight gain, which is minimal as well as irregular menstruation as an adverse effect of hormonal contraception -Follow up in 3 months for Depo

## 2021-02-09 NOTE — Assessment & Plan Note (Addendum)
Patient currently in [employer] school and notes this as a stressor. Has a history of depression but denies any current mood symptoms like hopelessness, SI, guilt, etc. that she feels contribute to her fatigue. No concern for malignancy and any red flag symptoms like weight loss or fevers. More likely secondary to post viral syndrome vs. life stressors. Could potentially be a B12 deficiency, anemia or hypothyroidism. No documented history or signs on physical exam of anemia including tachycardia or pallor. No goiter.  - Encouraged patient to eat multiple small meals through the day to maintain energy levels, instead of one meal a day - Encouraged to use humidifier with intranasal steroid and antihistamine; follow up with ENT - Obtained labs today: B12, CBC, TSH. Will follow up on these.

## 2021-02-10 LAB — CBC
Hematocrit: 43.4 % (ref 34.0–46.6)
Hemoglobin: 14.5 g/dL (ref 11.1–15.9)
MCH: 29.7 pg (ref 26.6–33.0)
MCHC: 33.4 g/dL (ref 31.5–35.7)
MCV: 89 fL (ref 79–97)
Platelets: 309 10*3/uL (ref 150–450)
RBC: 4.89 x10E6/uL (ref 3.77–5.28)
RDW: 11.4 % — ABNORMAL LOW (ref 11.7–15.4)
WBC: 7.7 10*3/uL (ref 3.4–10.8)

## 2021-02-10 LAB — HCV AB W REFLEX TO QUANT PCR: HCV Ab: <0.1 s/co ratio (ref 0.0–0.9)

## 2021-02-10 LAB — HCV INTERPRETATION

## 2021-02-10 LAB — VITAMIN B12: Vitamin B-12: 361 pg/mL (ref 232–1245)

## 2021-02-10 LAB — TSH: TSH: 1.6 u[IU]/mL (ref 0.450–4.500)

## 2021-03-09 ENCOUNTER — Ambulatory Visit: Payer: Medicaid Other

## 2021-03-09 NOTE — Progress Notes (Deleted)
° ° °  SUBJECTIVE:   CHIEF COMPLAINT / HPI:   Headache/congestion: ***  PERTINENT  PMH / PSH: History of tension style headache  OBJECTIVE:   There were no vitals taken for this visit. ***  General: NAD, pleasant, able to participate in exam Cardiac: RRR, no murmurs. Respiratory: CTAB, normal effort, No wheezes, rales or rhonchi Abdomen: Bowel sounds present, nontender, nondistended, no hepatosplenomegaly. Extremities: no edema or cyanosis. Skin: warm and dry, no rashes noted Neuro: alert, no obvious focal deficits, CN II through XII intact, fine touch sensation intact in upper and lower extremities bilaterally, strength 5/5 in upper and lower extremities bilaterally Psych: Normal affect and mood  ASSESSMENT/PLAN:   No problem-specific Assessment & Plan notes found for this encounter.     Jackelyn Poling, DO  [facility]    {    This will disappear when note is signed, click to select method of visit    :1}

## 2021-03-29 ENCOUNTER — Ambulatory Visit (INDEPENDENT_AMBULATORY_CARE_PROVIDER_SITE_OTHER): Payer: Medicaid Other | Admitting: Student

## 2021-03-29 DIAGNOSIS — Z91199 Patient's noncompliance with other medical treatment and regimen due to unspecified reason: Secondary | ICD-10-CM

## 2021-03-29 NOTE — Patient Instructions (Signed)
Erroneous encounter

## 2021-03-29 NOTE — Progress Notes (Incomplete)
° ° °  SUBJECTIVE:   CHIEF COMPLAINT / HPI:   Vaginal Discharge: Patient is a 21 y.o. female presenting with vaginal discharge for *** days.  She states the discharge is of *** consistency.  She endorses *** vaginal odor.  She is interested in screening for sexually transmitted infections today. LMP Sexual partners Birth control  PERTINENT  PMH / PSH: ***None relevant  OBJECTIVE:   There were no vitals taken for this visit.   General: NAD, pleasant, able to participate in exam Respiratory: Normal effort, no obvious respiratory distress Pelvic: VULVA: normal appearing vulva with no masses, tenderness or lesions, VAGINA: Normal appearing vagina with normal color, no lesions, with {GYN VAGINAL DISCHARGE:21986} discharge present, ***CERVIX: No lesions, {GYN VAGINAL DISCHARGE:21986} discharge present,  Chaperone *** present for pelvic exam  ASSESSMENT/PLAN:   No problem-specific Assessment & Plan notes found for this encounter.    Assessment:  21 y.o. female with vaginal discharge for***days, as well as***.  Physical exam significant for*** discharge.  Wet prep performed today shows *** consistent with ***.  Patient is interested in STI screening.   Plan: -Wet prep as above.  Will treat with***. -GC/chlamydia pending -Will check HIV and RPR  Darral Dash, DO [facility]

## 2021-03-30 ENCOUNTER — Encounter: Payer: Self-pay | Admitting: Family Medicine

## 2021-03-30 NOTE — Progress Notes (Signed)
Patient has no-showed to multiple appointments in a 6-month period. Per our no-show policy, a letter has been routed to [facility] Admin to be mailed to patient regarding likely dismissal for repeat no show. Will CC to PCP.  ° °

## 2021-04-02 NOTE — Progress Notes (Signed)
No show

## 2021-04-18 DIAGNOSIS — J069 Acute upper respiratory infection, unspecified: Secondary | ICD-10-CM | POA: Diagnosis not present

## 2021-04-18 DIAGNOSIS — N3001 Acute cystitis with hematuria: Secondary | ICD-10-CM | POA: Diagnosis not present

## 2021-04-18 DIAGNOSIS — R1110 Vomiting, unspecified: Secondary | ICD-10-CM | POA: Diagnosis not present

## 2021-04-26 IMAGING — CT CT ABD-PELV W/ CM
2 of 4 series · 17 of 46 positions shown, 19 images · IV contrast (Omni 300)
Comparison: None.

CLINICAL DATA: Right lower quadrant pain, nausea and vomiting
beginning 07/18/2019.

EXAM:
CT ABDOMEN AND PELVIS WITH CONTRAST
TECHNIQUE: Multidetector CT imaging of the abdomen and pelvis was performed
using the standard protocol following bolus administration of
intravenous contrast.
CONTRAST:  100 mL OMNIPAQUE IOHEXOL 300 MG/ML  SOLN

[Series 3: a/p w/ 5mm · axial · 0.63mm/px · z∈[-482,-72]mm · 14 of 90 slices shown, 16 images]
[im 4/90  soft-tissue]
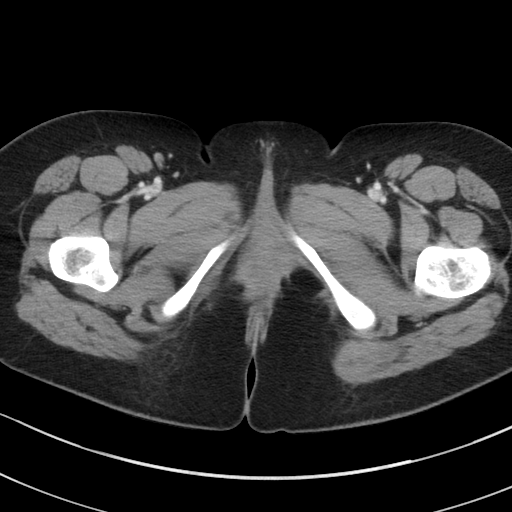
[im 4/90  bone]
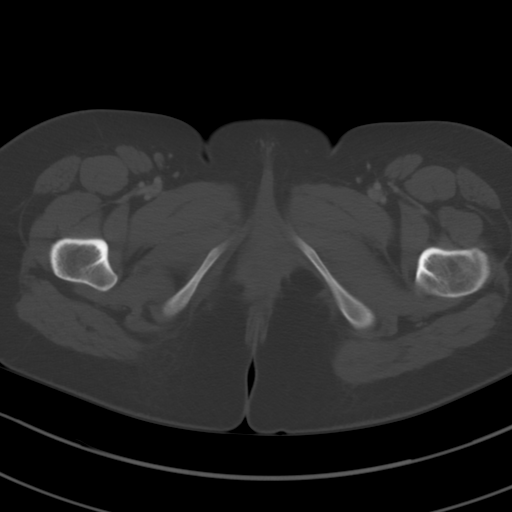
[im 11/90  soft-tissue]
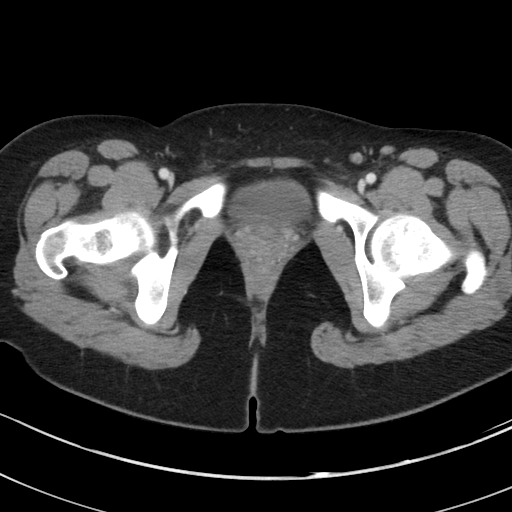
[im 18/90  soft-tissue]
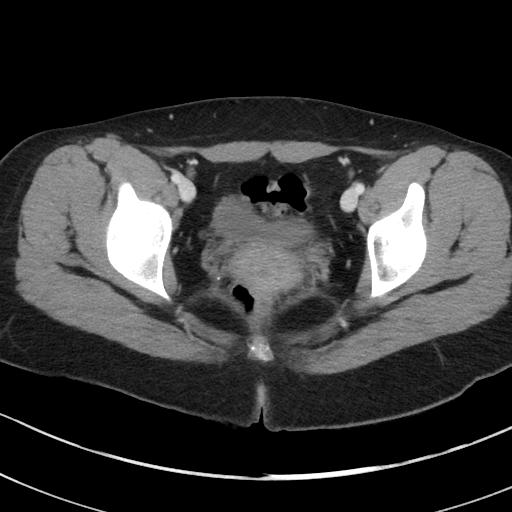
[im 24/90  soft-tissue]
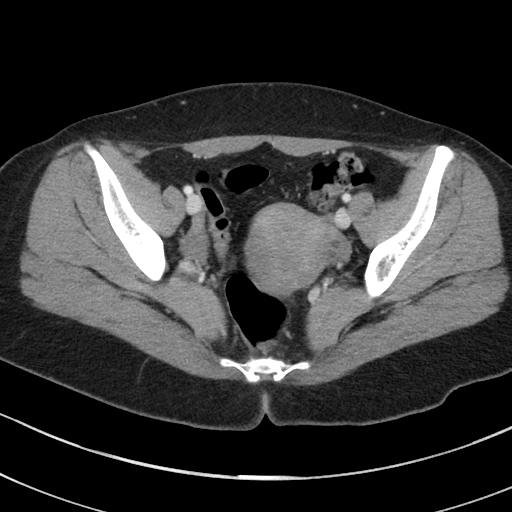
[im 31/90  soft-tissue]
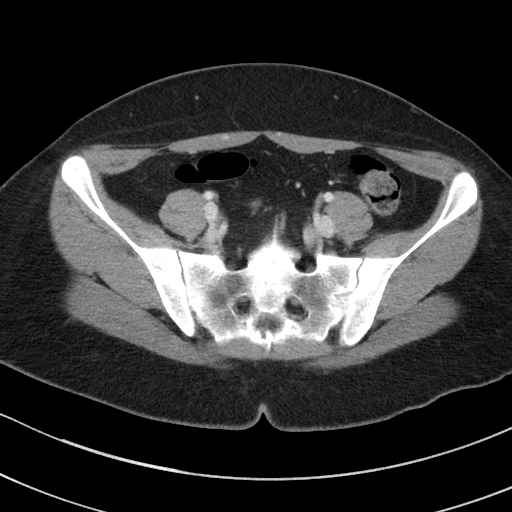
[im 35/90  soft-tissue]
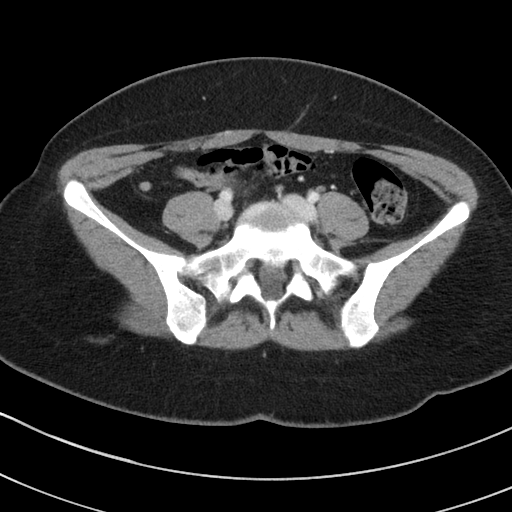
[im 42/90  soft-tissue]
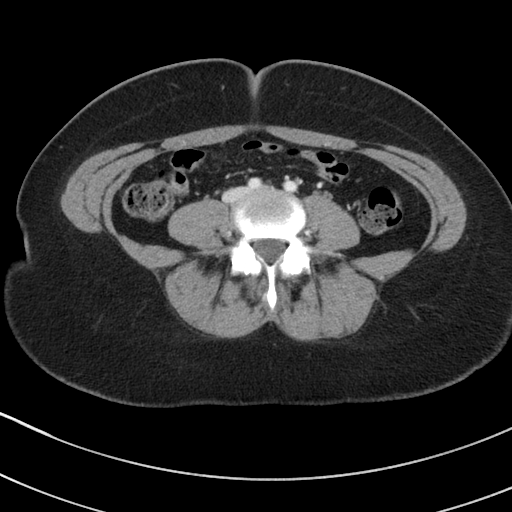
[im 48/90  soft-tissue]
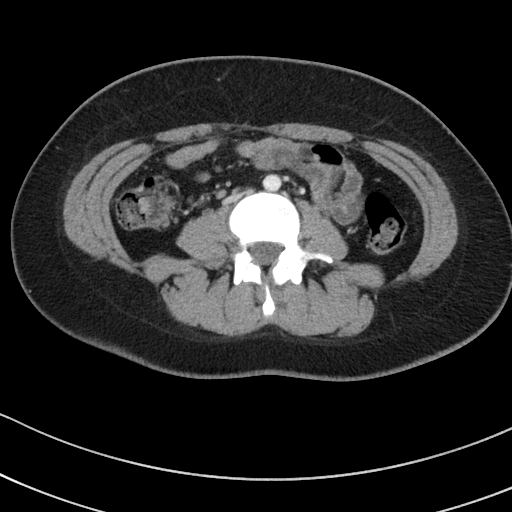
[im 55/90  soft-tissue]
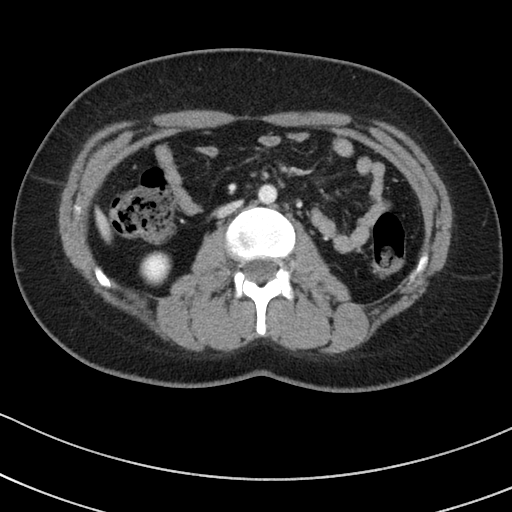
[im 55/90  bone]
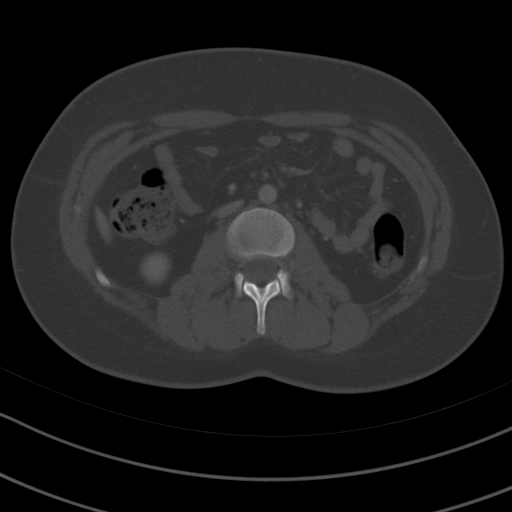
[im 59/90  soft-tissue]
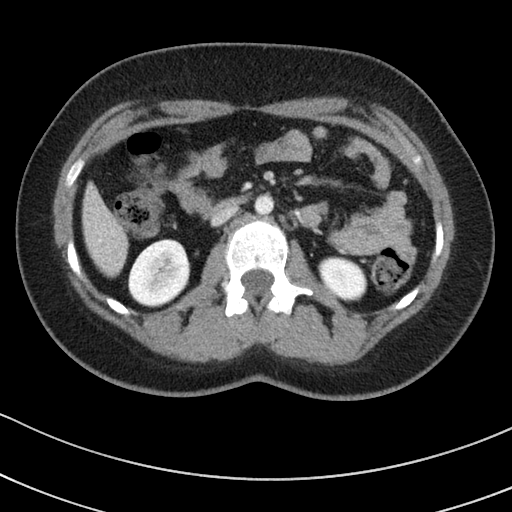
[im 66/90  soft-tissue]
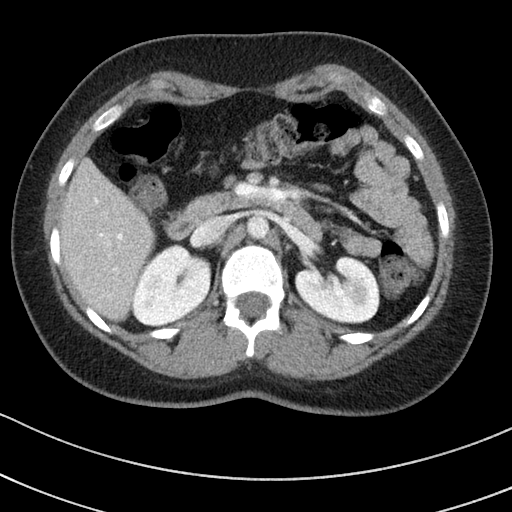
[im 72/90  soft-tissue]
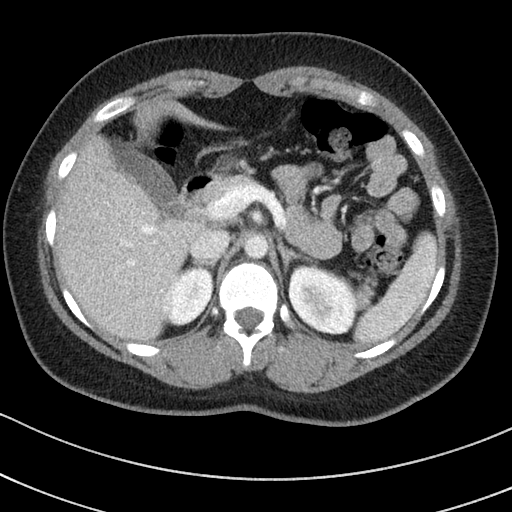
[im 79/90  soft-tissue]
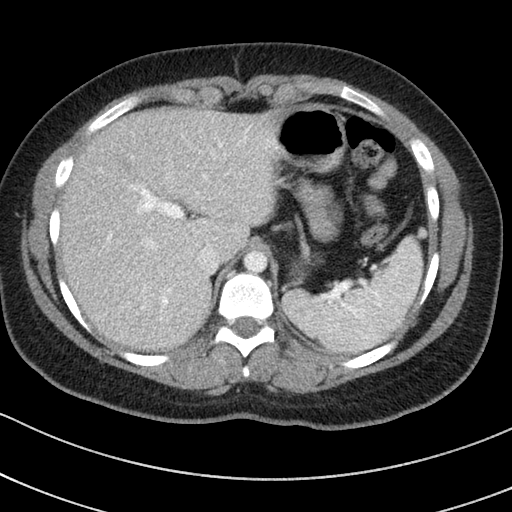
[im 86/90  soft-tissue]
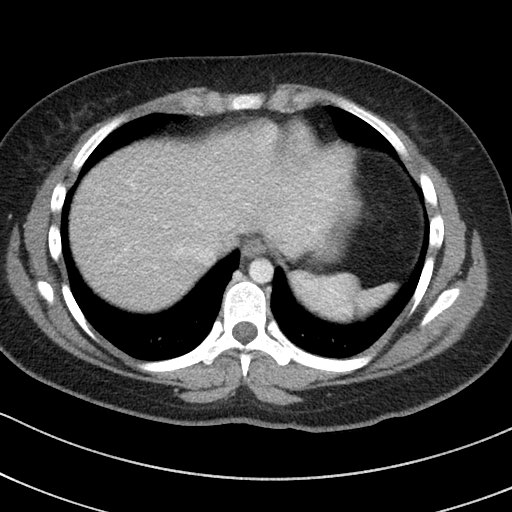

[Series 6: a/p w/ cor · coronal · 0.77mm/px · 3 of 122 slices shown]
[im 41/122  soft-tissue]
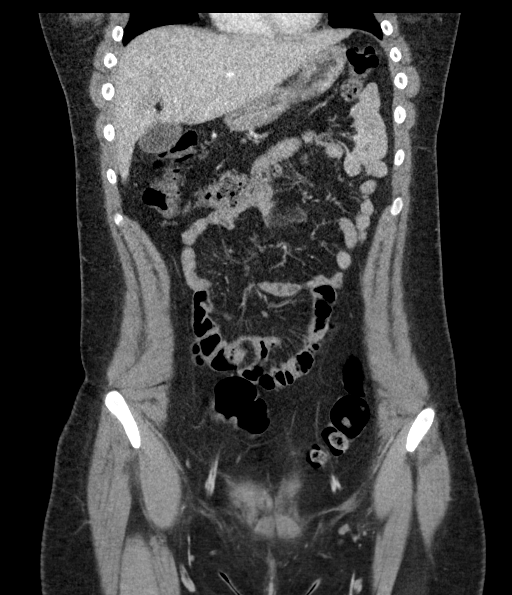
[im 54/122  soft-tissue]
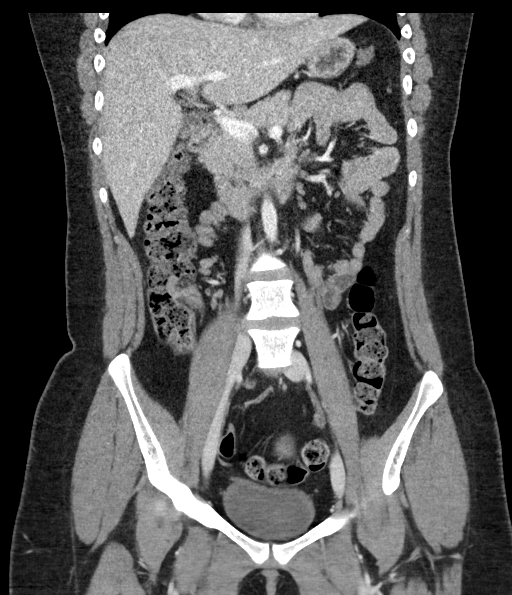
[im 68/122  soft-tissue]
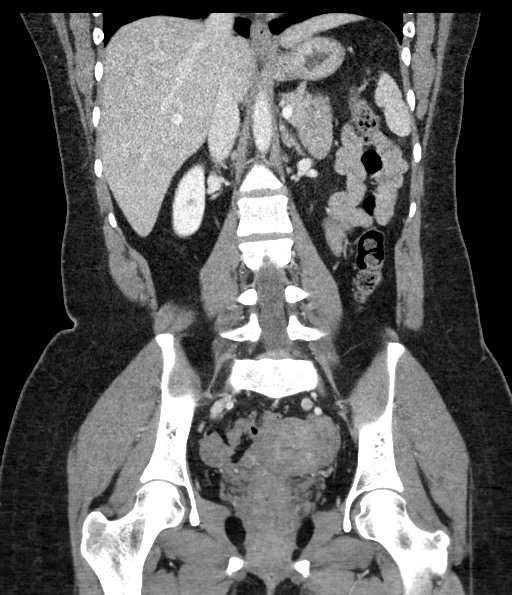

[17 of 46 positions shown; findings below may reference images not displayed]

FINDINGS: Lower chest: Lung bases clear.  No pleural or pericardial effusion.

Hepatobiliary: No focal liver abnormality is seen. No gallstones,
gallbladder wall thickening, or biliary dilatation.

Pancreas: Unremarkable. No pancreatic ductal dilatation or
surrounding inflammatory changes.

Spleen: Normal in size without focal abnormality.

Adrenals/Urinary Tract: Adrenal glands are unremarkable. Kidneys are
normal, without renal calculi, focal lesion, or hydronephrosis.
Bladder is unremarkable.

Stomach/Bowel: Stomach is within normal limits. Appendix appears
normal. No evidence of bowel wall thickening, distention, or
inflammatory changes.

Vascular/Lymphatic: No significant vascular findings are present. No
enlarged abdominal or pelvic lymph nodes.

Reproductive: Uterus and bilateral adnexa are unremarkable.

Other: None.

Musculoskeletal: Negative.
IMPRESSION: Negative for appendicitis.  Negative abdomen pelvis CT.

## 2021-05-08 ENCOUNTER — Ambulatory Visit: Payer: Medicaid Other

## 2021-05-09 ENCOUNTER — Ambulatory Visit: Payer: Medicaid Other

## 2021-05-09 NOTE — Progress Notes (Deleted)
? ? ?  SUBJECTIVE:  ? ?CHIEF COMPLAINT / HPI:  ?No chief complaint on file. ?  ?*** ? ?PERTINENT  PMH / PSH: *** ? ?Patient Care Team: ?Katha Cabal, DO as PCP - General (Family Medicine)  ? ?OBJECTIVE:  ? ?There were no vitals taken for this visit.  ?Physical Exam  ? ? ?  02/09/2021  ?  2:26 PM  ?Depression screen PHQ 2/9  ?Decreased Interest 0  ?Down, Depressed, Hopeless 0  ?PHQ - 2 Score 0  ?Altered sleeping 0  ?Tired, decreased energy 0  ?Change in appetite 0  ?Feeling bad or failure about yourself  0  ?Trouble concentrating 0  ?Moving slowly or fidgety/restless 0  ?Suicidal thoughts 0  ?PHQ-9 Score 0  ?  ? ?{Show previous vital signs (optional):23777} ? ?{Labs  Heme  Chem  Endocrine  Serology  Results Review (optional):23779} ? ?ASSESSMENT/PLAN:  ? ?No problem-specific Assessment & Plan notes found for this encounter. ?  ? ?No follow-ups on file.  ? ?Littie Deeds, MD ?[facility]  ?

## 2021-05-09 NOTE — Patient Instructions (Incomplete)
It was nice seeing you today!  Blood work today.  See me in 3 months or whenever is a good for you.  Stay well, Kerri Pickert, MD [facility] [phone]  --  Make sure to check out at the front desk before you leave today.  Please arrive at least 15 minutes prior to your scheduled appointments.  If you had blood work today, I will send you a MyChart message or a letter if results are normal. Otherwise, I will give you a call.  If you had a referral placed, they will call you to set up an appointment. Please give us a call if you don't hear back in the next 2 weeks.  If you need additional refills before your next appointment, please call your pharmacy first.  

## 2021-05-10 ENCOUNTER — Encounter: Payer: Self-pay | Admitting: Family Medicine

## 2021-05-10 ENCOUNTER — Ambulatory Visit: Payer: Medicaid Other | Admitting: Family Medicine

## 2021-05-10 VITALS — BP 95/67 | HR 82 | Ht 63.0 in | Wt 150.6 lb

## 2021-05-10 DIAGNOSIS — G5601 Carpal tunnel syndrome, right upper limb: Secondary | ICD-10-CM

## 2021-05-10 DIAGNOSIS — F32A Depression, unspecified: Secondary | ICD-10-CM

## 2021-05-10 MED ORDER — SERTRALINE HCL 25 MG PO TABS: 25.0000 mg | ORAL_TABLET | Freq: Every day | ORAL | 1 refills | Status: DC

## 2021-05-10 NOTE — Patient Instructions (Signed)
For your carpal tunnel syndrome I want you to purchase a "carpal tunnel wrist splint" and wear this each night. ? ? ?For depression we are starting medication for you to take each day.  It would take about 4 weeks for ensure maximal effectiveness and I will see back in 4 weeks to see how you are doing and to talk about increasing the dose.  I also recommend starting counseling and providing some resources below for this.  If develop any thoughts of harming yourself please seek out help.  If you are not able to get in with a counselor in a reasonable amount of time please schedule follow-up with me or your primary doctor next week to talk about additional things we can do to help with the depression. ? ? ? ?Therapy and Counseling Resources ?Most providers on this list will take Medicaid. Patients with commercial insurance or Medicare should contact their insurance company to get a list of in network providers. ? ?[facility] (spanish speaking therapist available)(habla espanol)  ?[address], [place], [place] [address], [place] ?[email or web address] ?[phone] ? ?[facility] ?[address] [place], [place] [address] ?[phone] ? ?[facility] ? [address], Suite [place], [place] [address]      [phone] ? ?[facility] ?[address]  [place], [place] [address] ?[phone] ? ?[facility] ?[address]  [place], [place] [address]       [phone]    ? ?[facility] (virtual only) ?[phone] ? ?[facility] ?[address] [place], [place] [phone] ? ?[facility]:  [address] [place] [phone] ? ?[facility]:  ?Coralie Carpen [phone] ? ?[employer] (under & uninsured) ?[address]   [place] [phone]    [email or web address]   ? ?[facility] ?[address]  [place]    [phone] ? ?[facility] ?[facility] -[address]     Phone:  [phone]     [employer]-  [address]  [phone]  ? ?[facility] ?[address] [place], [place] [phone] ? ?[facility]: [address] [place], [place], [place]  [phone]  ? ?[facility] (Spanish therapist) [email or web address]  ?[address] [place]  [address]   [place] [address]    [phone]   ? ?[facility]   ?[address],  [place], [place]  [phone]  ? ?[facility]  ?[address] [place]  [phone] ? ?[facility]  ?[address] [place], [place]        [phone] ? ?Open Access/Walk In Clinic under & uninsured ? ?[facility]  ?[address] [place], [place] ?Front Line [phone] ?Crisis [phone] ? ?[facility] the [address],  ?(Spanish)   [address], [place]: [phone]) 8:30 - 12; 1 - 2:30 ? ?[facility] of the [employer],  ?[address] [place]    [phone]):8:30 - 12; 2 - 3PM ? ?[facility] [employer],  ?[address]  [place]; [phone]):   Mon - Fri 8 AM - 5 PM ? ?[facility] ?[address] [place]  MWF 12:30 to 3:00 or call to schedule an appointment  [phone] ? ?Specific Provider options ?  Psychology Today  https://www.psychologytoday.com/us ?click on find a therapist  ?enter your zip code ?left side and select or tailor a therapist for your specific need.  ? ?[facility] Provider Directory ?[email or web address]  (Medicaid)   Follow all drop down to find a provider ? ?Social Support program ?[facility] [place] ?336) I7437963 or [employer].pl ?[address], [place], [place] Recovery support and educational  ? ?24- Hour Availability:  ? ?[facility]  ?[address]  [address] [place], [place] ?Front Line [phone] ?Crisis [phone] ? ?[facility] the [facility] [phone] ? ?[employer] [facility]  [phone]  ? ?[facility] [employer]  [phone] (after hours) ? ?Therapeutic Alternative/Mobile Crisis   [phone] ? ?[place] National Suicide Hotline  [phone] Len Childs) ? ?Call 911 or go to emergency room ? ?[employer]  [phone]);  [place]  ? ?[facility]  ?[phone]); [place], [place], [place], [place], [place], [place], [place]  ? ? ? ?If you are feeling suicidal or depression symptoms worsen please immediately go to:  ? ?If you are thinking about harming yourself or having thoughts of suicide, or if you know someone who is, seek help right away. ?If you are in crisis, make sure you are not left alone.  ?If someone else is in crisis, make sure he/she/they is not left alone ? ?Call 988 OR 1-800-273-TALK ? ?24 Hour Availability for Walk-IN services  ?[facility]  ?[address] [place], [place] ?Front Line [phone] ?Crisis [phone] ? ? ? ?Other crisis resources: ? ?Family Service of the [employer] ?(Domestic Violence, Rape & Victim Assistance ?[phone] ? ?[facility] [employer]    ?(ONLY from 8am-4pm)    ?[phone] ? ?Therapeutic Alternative Mobile Crisis Unit (24/7)   ?[phone] ? ?[place] National Suicide Hotline   ?8257066282 Len Childs)  ?
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
## 2021-07-10 ENCOUNTER — Encounter: Payer: Self-pay | Admitting: *Deleted

## 2021-07-18 DIAGNOSIS — J069 Acute upper respiratory infection, unspecified: Secondary | ICD-10-CM | POA: Diagnosis not present

## 2021-07-18 DIAGNOSIS — J019 Acute sinusitis, unspecified: Secondary | ICD-10-CM | POA: Diagnosis not present

## 2021-07-31 ENCOUNTER — Ambulatory Visit (INDEPENDENT_AMBULATORY_CARE_PROVIDER_SITE_OTHER): Payer: Medicaid Other | Admitting: Family Medicine

## 2021-07-31 ENCOUNTER — Encounter: Payer: Self-pay | Admitting: Family Medicine

## 2021-07-31 DIAGNOSIS — F411 Generalized anxiety disorder: Secondary | ICD-10-CM | POA: Diagnosis not present

## 2021-07-31 NOTE — Progress Notes (Signed)
    SUBJECTIVE:   CHIEF COMPLAINT / HPI: Concern for contact with dog worms  Patient presented to clinic with concern for tapeworm.  She recently got a puppy who is now 53 weeks old and found worms on her bed.  She had treated the puppy twice now.  The dog has been vaccinated.  She is concerned that she may have contacted worms from the dog.  She is currently asymptomatic, denies any loss, abdominal pain, vomiting, diarrhea or fevers.  Orts that she is very anxious and is "stressing out "about the possibility of catching worms.  She has another dog who had worms in the past and was treated.  PERTINENT  PMH / PSH:  Mood disorder  OBJECTIVE:   BP 102/60   Pulse 74   Ht 5\' 3"  (1.6 m)   Wt 151 lb (68.5 kg)   SpO2 99%   BMI 26.75 kg/m    General: Alert, no acute distress Cardio: Normal S1 and S2, RRR, no r/m/g Pulm: CTAB, normal work of breathing Abdomen: Bowel sounds normal. Abdomen soft and non-tender.    ASSESSMENT/PLAN:   Anxious reaction Reassurance provided.  Discussed zoonotic infections and transmission.  Given that the animal has treated very low probability of transmission.  She has an appointment with the vet today.  Recommend that she discuss proper treatment of dog worms with visit to ensure eradication. Encouraged handwashing, separate sleeping area for animal. Patient appeared more relaxed at the end of visit.  Was appreciative of discussion.  She will follow-up with PCP as needed.     , MD [facility]

## 2021-08-02 ENCOUNTER — Ambulatory Visit: Payer: Medicaid Other | Admitting: Student

## 2021-08-03 ENCOUNTER — Encounter: Payer: Self-pay | Admitting: Family Medicine

## 2021-08-03 DIAGNOSIS — F411 Generalized anxiety disorder: Secondary | ICD-10-CM | POA: Insufficient documentation

## 2021-08-03 NOTE — Assessment & Plan Note (Signed)
Reassurance provided.  Discussed zoonotic infections and transmission.  Given that the animal has treated very low probability of transmission.  She has an appointment with the vet today.  Recommend that she discuss proper treatment of dog worms with visit to ensure eradication. Encouraged handwashing, separate sleeping area for animal. Patient appeared more relaxed at the end of visit.  Was appreciative of discussion.  She will follow-up with PCP as needed.

## 2021-09-11 DIAGNOSIS — R197 Diarrhea, unspecified: Secondary | ICD-10-CM | POA: Diagnosis not present

## 2021-09-11 DIAGNOSIS — Z3202 Encounter for pregnancy test, result negative: Secondary | ICD-10-CM | POA: Diagnosis not present

## 2021-09-11 DIAGNOSIS — R112 Nausea with vomiting, unspecified: Secondary | ICD-10-CM | POA: Diagnosis not present

## 2021-09-24 DIAGNOSIS — S93402A Sprain of unspecified ligament of left ankle, initial encounter: Secondary | ICD-10-CM | POA: Diagnosis not present

## 2021-09-24 DIAGNOSIS — S93602A Unspecified sprain of left foot, initial encounter: Secondary | ICD-10-CM | POA: Diagnosis not present

## 2021-11-02 ENCOUNTER — Ambulatory Visit: Payer: Medicaid Other | Admitting: Student

## 2021-11-13 NOTE — Progress Notes (Deleted)
Pregnancy or not- desiring  Regulate w/ contraception using provera 10 day challenge to see if we can start a period  Check thyroid and A1c

## 2021-11-16 ENCOUNTER — Ambulatory Visit: Payer: Medicaid Other | Admitting: Student

## 2022-01-01 DIAGNOSIS — R0981 Nasal congestion: Secondary | ICD-10-CM | POA: Diagnosis not present

## 2022-01-01 DIAGNOSIS — J020 Streptococcal pharyngitis: Secondary | ICD-10-CM | POA: Diagnosis not present

## 2022-01-03 ENCOUNTER — Ambulatory Visit: Payer: Medicaid Other | Admitting: Student

## 2022-01-07 ENCOUNTER — Ambulatory Visit: Payer: Medicaid Other | Admitting: Student

## 2022-01-16 ENCOUNTER — Ambulatory Visit: Payer: Medicaid Other | Admitting: Student

## 2022-01-24 ENCOUNTER — Ambulatory Visit: Payer: Medicaid Other | Admitting: Student

## 2022-01-25 ENCOUNTER — Encounter: Payer: Self-pay | Admitting: Student

## 2022-01-25 ENCOUNTER — Ambulatory Visit: Payer: Medicaid Other | Admitting: Student

## 2022-01-25 VITALS — BP 100/80 | HR 85 | Temp 98.7°F | Ht 63.0 in | Wt 166.0 lb

## 2022-01-25 DIAGNOSIS — F39 Unspecified mood [affective] disorder: Secondary | ICD-10-CM | POA: Diagnosis not present

## 2022-01-25 DIAGNOSIS — Z30013 Encounter for initial prescription of injectable contraceptive: Secondary | ICD-10-CM

## 2022-01-25 MED ORDER — MEDROXYPROGESTERONE ACETATE 150 MG/ML IM SUSY
150.0000 mg | PREFILLED_SYRINGE | Freq: Once | INTRAMUSCULAR | Status: AC
  Administered 2022-01-25: 150 mg via INTRAMUSCULAR

## 2022-01-25 MED ORDER — FLUOXETINE HCL 20 MG PO CAPS: 20.0000 mg | ORAL_CAPSULE | Freq: Every day | ORAL | 0 refills | Status: AC

## 2022-01-25 NOTE — Patient Instructions (Addendum)
It was great to see you! Thank you for allowing me to participate in your care!  Our plans for today:  -A prescription for fluoxetine has been sent to your pharmacy.  If after starting fluoxetine, you have any worsening depressive thoughts, including thoughts of harming yourself please stop this medication and return to our office or seek help at [facility] behavioral health. -Follow-up in 1 month  Take care and seek immediate care sooner if you develop any concerns.   Dr. Erick Alley, DO [facility]     Therapy and Counseling Resources Most providers on this list will take Medicaid. Patients with commercial insurance or Medicare should contact their insurance company to get a list of in network providers.  [facility] (spanish speaking therapist available)(habla espanol)(take medicare and medicaid)  [address], [place], [place] [address], [place] [email or web address] [phone]  [facility]iatry and Counseling [address] [facility] [address] [place], [place] [address] [phone]  [facility]   [address] [place] [address] [place], [place] [address]      [phone]  [facility] (spanish available) [address]  [place], [place] [address] [phone]  [facility] (take [facility] and medicare) [address]  [place], [place] [address]       [phone]     [facility] (virtual only) [phone]  [facility] [address] [place], [place] [phone]  [facility]:  [address] [place] [phone]  [facility]:  [address] [place] [phone]  [facility] (under & uninsured) [address]   [place] [phone]    [email or web address]    [facility] [address]  [place]    [phone]  [facility] -[address]     Phone:  [phone]     [facility]-  [address] [place]  [phone]   [facility]      [place], [place] [phone]  [facility]: [address] [place], [place], [place]  [phone]   [facility] (Spanish therapist) [email or web address]  [address] [place]  [address]   [place] [address]    [phone]    [facility]   [address],  [place], [place]  [phone]   [facility]  [address] [place]  [phone]  [facility]  [address] [place], [place]        [phone]  Open Access/Walk In Clinic under & uninsured  [facility]  [address] [place], [place] [phone] Crisis [phone]  [facility] [place],  (Spanish)   [address], [place]: [phone]) 8:30 - 12; 1 - 2:30  [facility] the [employer],  [address] [place], [place]    [phone]):8:30 - 12; 2 - 3PM  [facility] [employer],  [address],  [place]; [phone]):   Mon - Fri 8 AM - 5 PM  [facility] [address] [place]  MWF 12:30 to 3:00 or call to schedule an appointment  [phone]  Specific Provider options Psychology Today  https://www.psychologytoday.com/us click on find a therapist  enter your zip code left side and select or tailor a therapist for your specific need.   [facility] Provider  Directory [email or web address]  (Medicaid)   Follow all drop down to find a provider  [place] or [email or web address] [address], [place], [place] Recovery support and educational   24- Hour Availability:   [facility]  [address] [place], [place] Crisis [phone]  Family Service of the [employer] [phone]  [place]  [phone]   [place]  [phone] (after hours)  Therapeutic Alternative/Mobile Crisis   [phone]  [place] National Suicide Hotline  301-629-6657 Diamantina Monks)  Call 911 or go to emergency room  [facility]  [phone]);  [place] [facility] [place]  [phone]); [place], [place], [place], [place], [place], [place], [place]

## 2022-01-25 NOTE — Assessment & Plan Note (Signed)
Depo shot given today 

## 2022-01-25 NOTE — Progress Notes (Signed)
Patient presents for initial Depo- Provera.  Patient on Depo prior and discontinued in March.  Desires to restart today.  Patient is currently menstruating and provider cancelled urine pregnancy test.  Injection given in LUOQ.  Patient tolerated it well.  Reminder card given of March 9-23,2024.  Glennie Hawk, CMA

## 2022-01-25 NOTE — Assessment & Plan Note (Addendum)
Patient would like to try a different SSRI.  Will start fluoxetine 20 mg daily, can increase to 40 mg at 1 month follow up if needed. Therapy resources provided.

## 2022-01-25 NOTE — Progress Notes (Signed)
    SUBJECTIVE:   CHIEF COMPLAINT / HPI:   Irregular periods  contraception  Have been irregular since onset age 21.  Has been evaluated for this in the past.  TSH normal in January of 2023. Has been on depo shot since age 67 until March of 2023. Is now sexually active now, uses condoms. Would like to start depo again. Is currently on her period. She is interested in STD testing at next visit when she is not on her period. Does not think she has an STD but would like to be safe.   Depression and anxiety  Was previously taking Zoloft. While it helps with anxiety, she decided to stop as it makes her feel numb like she has no emotions. No thoughts of self harm but does states she has days (1 or 2 days every couple months) where she is sad, worse lately d/t holidays. When she feels like this, she hangs out with friends and family, goes to the gym. Is interested in therapy, would like resources. She states her anxiety is not under control, worries about everything constantly, picks at her fingers when anxious. She is now having social anxiety, gets stressed before going into stores. Sometimes feels like her heart is beating very fast and feels like she is about to explode. These episodes occur a couple times a month. She would like to try a different SSRI   PERTINENT  PMH / PSH: Depression, irregular periods   OBJECTIVE:   Vitals:   01/25/22 1339  BP: 100/80  Pulse: 85  Temp: 98.7 F (37.1 C)  SpO2: 98%     General: NAD, pleasant, able to participate in exam Cardiac: Well-perfused Respiratory: Breathing comfortably on room air Skin: warm and dry Neuro: alert, no obvious focal deficits Psych: Normal affect and mood  ASSESSMENT/PLAN:   Mood disorder (HCC) Patient would like to try a different SSRI.  Will start fluoxetine 20 mg daily, can increase to 40 mg at 1 month follow up if needed. Therapy resources provided.    Contraception management -Depo shot given today    Plan for  routine STD testing at next visit if appropriate and desired.   Dr. Erick Alley, DO [facility]

## 2022-02-07 ENCOUNTER — Ambulatory Visit: Payer: Medicaid Other | Admitting: Student

## 2022-02-07 ENCOUNTER — Encounter: Payer: Self-pay | Admitting: Student

## 2022-02-07 VITALS — BP 110/70 | HR 91 | Ht 63.0 in | Wt 164.2 lb

## 2022-02-07 DIAGNOSIS — N390 Urinary tract infection, site not specified: Secondary | ICD-10-CM

## 2022-02-07 MED ORDER — CEPHALEXIN 500 MG PO CAPS: 500.0000 mg | ORAL_CAPSULE | Freq: Two times a day (BID) | ORAL | 0 refills | Status: DC

## 2022-02-07 NOTE — Patient Instructions (Addendum)
It was great to see you today! Thank you for choosing [facility] for your primary care. Kerri Mann was seen for follow up.  Today we addressed: Continue with Keflex twice a day x7 days  I will call or message with any changes  If you experience fever, chills, confusion, worsening symptoms, please return  If you [place]'t already, sign up for My Chart to have easy access to your labs results, and communication with your primary care physician.  We are checking some labs today. If they are abnormal, I will call you. If they are normal, I will send you a MyChart message (if it is active) or a letter in the mail. If you do not hear about your labs in the next 2 weeks, please call the office. I recommend that you always bring your medications to each appointment as this makes it easy to ensure you are on the correct medications and helps [place] not miss refills when you need them. Call the clinic at [phone] if your symptoms worsen or you have any concerns.  You should return to our clinic Return if symptoms worsen or fail to improve. Please arrive 15 minutes before your appointment to ensure smooth check in process.  We appreciate your efforts in making this happen.  Thank you for allowing me to participate in your care, Erskine Emery, MD 02/07/2022, 4:24 PM PGY-2, [place]

## 2022-02-07 NOTE — Progress Notes (Signed)
  SUBJECTIVE:   CHIEF COMPLAINT / HPI:   Last seen by Dr. Ronnald Ramp on 01/25/2022.  At this time, the patient was experiencing irregular periods and received her Depo-Provera on that visit.    Urinary Frequency:  Today, she reports new symptoms of increased urinary frequency over the last few days. Patient has had UTIs in the past, been about a year. She reports that the current symptoms are similar to those in the past when she had UTIs. No fever, chills, chest pain, SOB. Reports that there was a temporary moment of right sided back pain that quickly dissipated and has not returned. She has attempted use of Azo without significant relief.   PERTINENT  PMH / PSH: anxious mood, tension headache, irregular menstrual, h/o pyelonephritis     OBJECTIVE:  BP 110/70   Pulse 91   Ht 5\' 3"  (1.6 m)   Wt 164 lb 3.2 oz (74.5 kg)   LMP  (LMP Unknown)   SpO2 100%   BMI 29.09 kg/m  Physical Exam Vitals reviewed.  Constitutional:      Appearance: Normal appearance.  HENT:     Head: Normocephalic.     Nose: Nose normal.     Mouth/Throat:     Mouth: Mucous membranes are moist.  Eyes:     Conjunctiva/sclera: Conjunctivae normal.  Cardiovascular:     Rate and Rhythm: Normal rate and regular rhythm.  Pulmonary:     Effort: Pulmonary effort is normal.     Breath sounds: Normal breath sounds.  Abdominal:     General: Abdomen is flat. Bowel sounds are normal. There is no distension.     Palpations: Abdomen is soft. There is no mass.     Tenderness: There is no right CVA tenderness or left CVA tenderness.  Musculoskeletal:        General: Normal range of motion.     Cervical back: Normal range of motion.  Skin:    General: Skin is warm.     Capillary Refill: Capillary refill takes less than 2 seconds.  Neurological:     General: No focal deficit present.     Mental Status: She is alert.  Psychiatric:        Mood and Affect: Mood normal.        Behavior: Behavior normal.       ASSESSMENT/PLAN:  Urinary tract infection without hematuria, site unspecified Assessment & Plan: Unable to obtain UA/dipstick as she recently took Azo. Sent for urine culture but will continue w/ prophylactic treatment with Keflex, past ucx E Coli with TMP/SMX resistance, otherwise pansensitive. Will call if cultures return and require treatment change. Patient instructed of return precautions. No evidence of systemic changes.   Orders: -     Urine Culture -     Cephalexin; Take 1 capsule (500 mg total) by mouth 2 (two) times daily. For 7 days  Dispense: 14 capsule; Refill: 0   Return if symptoms worsen or fail to improve. Erskine Emery, MD 02/08/2022, 6:56 AM PGY-2, [place]

## 2022-02-08 ENCOUNTER — Encounter: Payer: Self-pay | Admitting: Student

## 2022-02-08 DIAGNOSIS — N390 Urinary tract infection, site not specified: Secondary | ICD-10-CM | POA: Insufficient documentation

## 2022-02-08 NOTE — Assessment & Plan Note (Signed)
Unable to obtain UA/dipstick as she recently took Azo. Sent for urine culture but will continue w/ prophylactic treatment with Keflex, past ucx E Coli with TMP/SMX resistance, otherwise pansensitive. Will call if cultures return and require treatment change. Patient instructed of return precautions. No evidence of systemic changes.

## 2022-02-12 ENCOUNTER — Encounter: Payer: Self-pay | Admitting: Student

## 2022-02-12 LAB — URINE CULTURE

## 2022-02-26 ENCOUNTER — Ambulatory Visit: Payer: Medicaid Other | Admitting: Student

## 2022-02-26 NOTE — Progress Notes (Deleted)
    SUBJECTIVE:   CHIEF COMPLAINT / HPI:   Anxiety Patient seen on 01/25/2022 for anxiety, started on fluoxetine 20 mg daily  PERTINENT  PMH / PSH: ***  OBJECTIVE:   There were no vitals taken for this visit. ***  General: NAD, pleasant, able to participate in exam Cardiac: RRR, no murmurs. Respiratory: CTAB, normal effort, No wheezes, rales or rhonchi Abdomen: Bowel sounds present, nontender, nondistended, no hepatosplenomegaly. Extremities: no edema or cyanosis. Skin: warm and dry, no rashes noted Neuro: alert, no obvious focal deficits Psych: Normal affect and mood  ASSESSMENT/PLAN:   No problem-specific Assessment & Plan notes found for this encounter.     Dr. Precious Gilding, [place]    {    This will disappear when note is signed, click to select method of visit    :1}

## 2022-03-06 ENCOUNTER — Ambulatory Visit: Payer: Medicaid Other | Admitting: Family Medicine

## 2022-03-06 ENCOUNTER — Encounter: Payer: Self-pay | Admitting: Family Medicine

## 2022-03-06 VITALS — BP 111/76 | HR 70 | Ht 62.0 in | Wt 161.0 lb

## 2022-03-06 DIAGNOSIS — Z23 Encounter for immunization: Secondary | ICD-10-CM | POA: Diagnosis not present

## 2022-03-06 DIAGNOSIS — R197 Diarrhea, unspecified: Secondary | ICD-10-CM | POA: Diagnosis not present

## 2022-03-06 MED ORDER — FAMOTIDINE 10 MG PO TABS: 10.0000 mg | ORAL_TABLET | Freq: Every day | ORAL | 1 refills | Status: AC

## 2022-03-06 MED ORDER — ONDANSETRON 4 MG PO TBDP: 4.0000 mg | ORAL_TABLET | Freq: Three times a day (TID) | ORAL | 0 refills | Status: AC | PRN

## 2022-03-06 NOTE — Progress Notes (Signed)
    SUBJECTIVE:   CHIEF COMPLAINT / HPI:   Stomach pain - Started on Sunday - Has had some nausea and watery diarrhea (having 2-3 episodes per day) - Has decreased appetite, but is making herself eat food - Has felt extremely tired - Denies any vomiting, blood in stools, significant weight loss, dysuria  PERTINENT  PMH / PSH: Reviewed  OBJECTIVE:   BP 111/76   Pulse 70   Ht 5\' 2"  (1.575 m)   Wt 161 lb (73 kg)   SpO2 100%   BMI 29.45 kg/m   Gen: well-appearing, NAD CV: RRR, no m/r/g appreciated, no peripheral edema Pulm: CTAB, no wheezes/crackles GI: soft, slightly TTP in the LLQ, non-distended, no peritoneal signs  ASSESSMENT/PLAN:   Diarrhea, presumed infectious History and physical seem more consistent with viral GI illness. No concern for pancreatitis or cholecystitis or UTI at this time. Patient is on day 4 of illness, anticipate improvement over the next few days. Patient does have some chronic burning sensation in her throat when waking in the morning, wonder if there is a reflux component. - Return precautions discussed - Zofran 4mg  ODT q8h PRN - Famotidine 10mg  QHS PRN - Encouraged good oral hydration   Rise Patience, DO [place]

## 2022-03-06 NOTE — Patient Instructions (Signed)
I think that this is a viral GI bug, which can take up to a week to start getting better (but it shouldn't be getting worse at this point). I have sent in a medication for nausea that can sometimes also help with diarrhea. If you start having any blood in your stool or are not improving by Friday, let our office know.  That burning sensation in your throat that you mentioned can be from reflux (which can also be worse when having a GI illness) so I have sent in a prescription for Famotidine (Pepcid) that you can take nightly. You can also get this over the counter if it ends up helping you.

## 2022-04-01 ENCOUNTER — Encounter: Payer: Self-pay | Admitting: Student

## 2022-04-15 ENCOUNTER — Ambulatory Visit (INDEPENDENT_AMBULATORY_CARE_PROVIDER_SITE_OTHER): Payer: Medicaid Other

## 2022-04-15 ENCOUNTER — Ambulatory Visit: Payer: Medicaid Other

## 2022-04-15 DIAGNOSIS — Z3042 Encounter for surveillance of injectable contraceptive: Secondary | ICD-10-CM | POA: Diagnosis not present

## 2022-04-15 MED ORDER — MEDROXYPROGESTERONE ACETATE 150 MG/ML IM SUSY
150.0000 mg | PREFILLED_SYRINGE | Freq: Once | INTRAMUSCULAR | Status: AC
  Administered 2022-04-15: 150 mg via INTRAMUSCULAR

## 2022-04-15 NOTE — Progress Notes (Signed)
Patient here today for Depo Provera injection and is within her dates.    Last contraceptive appt was 01/25/2022  Depo given in [place] today.  Site unremarkable & patient tolerated injection.    Next injection due 07/01/22-07/15/22.  Reminder card given.    Talbot Grumbling, RN

## 2022-04-26 DIAGNOSIS — Z20822 Contact with and (suspected) exposure to covid-19: Secondary | ICD-10-CM | POA: Diagnosis not present

## 2022-04-26 DIAGNOSIS — J3089 Other allergic rhinitis: Secondary | ICD-10-CM | POA: Diagnosis not present

## 2022-04-26 DIAGNOSIS — J069 Acute upper respiratory infection, unspecified: Secondary | ICD-10-CM | POA: Diagnosis not present

## 2022-07-03 ENCOUNTER — Ambulatory Visit: Payer: Medicaid Other

## 2022-07-04 ENCOUNTER — Ambulatory Visit (INDEPENDENT_AMBULATORY_CARE_PROVIDER_SITE_OTHER): Payer: Medicaid Other

## 2022-07-04 DIAGNOSIS — Z3042 Encounter for surveillance of injectable contraceptive: Secondary | ICD-10-CM

## 2022-07-04 MED ORDER — MEDROXYPROGESTERONE ACETATE 150 MG/ML IM SUSY
150.0000 mg | PREFILLED_SYRINGE | Freq: Once | INTRAMUSCULAR | Status: AC
  Administered 2022-07-04: 150 mg via INTRAMUSCULAR

## 2022-07-04 NOTE — Progress Notes (Signed)
Patient here today for Depo Provera injection and is within her dates.    Last contraceptive appt was 01/25/2022.  Depo given in [facility] today.  Site unremarkable & patient tolerated injection.    Next injection due 09/19/2022-10/03/2022.  Reminder card given.

## 2022-07-25 ENCOUNTER — Ambulatory Visit: Payer: Medicaid Other

## 2022-07-26 ENCOUNTER — Ambulatory Visit: Payer: Medicaid Other

## 2022-09-26 ENCOUNTER — Ambulatory Visit: Payer: Medicaid Other

## 2022-09-30 ENCOUNTER — Ambulatory Visit (INDEPENDENT_AMBULATORY_CARE_PROVIDER_SITE_OTHER): Payer: Medicaid Other

## 2022-09-30 ENCOUNTER — Ambulatory Visit: Payer: Medicaid Other

## 2022-09-30 DIAGNOSIS — Z3042 Encounter for surveillance of injectable contraceptive: Secondary | ICD-10-CM | POA: Diagnosis not present

## 2022-09-30 MED ORDER — MEDROXYPROGESTERONE ACETATE 150 MG/ML IM SUSY
150.0000 mg | PREFILLED_SYRINGE | Freq: Once | INTRAMUSCULAR | Status: AC
  Administered 2022-09-30: 150 mg via INTRAMUSCULAR

## 2022-09-30 NOTE — Progress Notes (Signed)
Patient here today for Depo Provera injection and is within her dates.    Last contraceptive appt was 01/25/2022  Depo given in [facility] today.  Site unremarkable & patient tolerated injection.    Next injection due 12/16/22-12/30/22.  Reminder card given.    Veronda Prude, RN

## 2022-11-08 DIAGNOSIS — R110 Nausea: Secondary | ICD-10-CM | POA: Diagnosis not present

## 2022-11-08 DIAGNOSIS — R109 Unspecified abdominal pain: Secondary | ICD-10-CM | POA: Diagnosis not present

## 2022-11-08 DIAGNOSIS — K5900 Constipation, unspecified: Secondary | ICD-10-CM | POA: Diagnosis not present

## 2022-11-08 DIAGNOSIS — R509 Fever, unspecified: Secondary | ICD-10-CM | POA: Diagnosis not present

## 2022-11-10 DIAGNOSIS — D72829 Elevated white blood cell count, unspecified: Secondary | ICD-10-CM | POA: Diagnosis not present

## 2022-11-10 DIAGNOSIS — N132 Hydronephrosis with renal and ureteral calculous obstruction: Secondary | ICD-10-CM | POA: Diagnosis not present

## 2022-11-10 DIAGNOSIS — Z5321 Procedure and treatment not carried out due to patient leaving prior to being seen by health care provider: Secondary | ICD-10-CM | POA: Diagnosis not present

## 2022-11-10 DIAGNOSIS — N1330 Unspecified hydronephrosis: Secondary | ICD-10-CM | POA: Diagnosis not present

## 2022-11-10 DIAGNOSIS — R1032 Left lower quadrant pain: Secondary | ICD-10-CM | POA: Diagnosis not present

## 2022-11-10 DIAGNOSIS — D72820 Lymphocytosis (symptomatic): Secondary | ICD-10-CM | POA: Diagnosis not present

## 2022-11-10 DIAGNOSIS — N3090 Cystitis, unspecified without hematuria: Secondary | ICD-10-CM | POA: Diagnosis not present

## 2022-11-11 DIAGNOSIS — N136 Pyonephrosis: Secondary | ICD-10-CM | POA: Diagnosis not present

## 2022-11-11 DIAGNOSIS — N200 Calculus of kidney: Secondary | ICD-10-CM | POA: Diagnosis not present

## 2022-11-11 DIAGNOSIS — Z79899 Other long term (current) drug therapy: Secondary | ICD-10-CM | POA: Diagnosis not present

## 2022-11-11 DIAGNOSIS — N201 Calculus of ureter: Secondary | ICD-10-CM | POA: Diagnosis not present

## 2022-11-11 DIAGNOSIS — G44209 Tension-type headache, unspecified, not intractable: Secondary | ICD-10-CM | POA: Diagnosis not present

## 2022-11-11 DIAGNOSIS — N390 Urinary tract infection, site not specified: Secondary | ICD-10-CM | POA: Diagnosis not present

## 2022-11-12 DIAGNOSIS — Z79899 Other long term (current) drug therapy: Secondary | ICD-10-CM | POA: Diagnosis not present

## 2022-11-12 DIAGNOSIS — N200 Calculus of kidney: Secondary | ICD-10-CM | POA: Diagnosis not present

## 2022-11-12 DIAGNOSIS — N136 Pyonephrosis: Secondary | ICD-10-CM | POA: Diagnosis not present

## 2022-11-12 DIAGNOSIS — G44209 Tension-type headache, unspecified, not intractable: Secondary | ICD-10-CM | POA: Diagnosis not present

## 2022-11-13 DIAGNOSIS — N200 Calculus of kidney: Secondary | ICD-10-CM | POA: Diagnosis not present

## 2022-11-18 DIAGNOSIS — Z79899 Other long term (current) drug therapy: Secondary | ICD-10-CM | POA: Diagnosis not present

## 2022-11-18 DIAGNOSIS — Z87440 Personal history of urinary (tract) infections: Secondary | ICD-10-CM | POA: Diagnosis not present

## 2022-11-18 DIAGNOSIS — N200 Calculus of kidney: Secondary | ICD-10-CM | POA: Diagnosis not present

## 2022-11-21 DIAGNOSIS — N201 Calculus of ureter: Secondary | ICD-10-CM | POA: Diagnosis not present

## 2022-11-21 DIAGNOSIS — N200 Calculus of kidney: Secondary | ICD-10-CM | POA: Diagnosis not present

## 2022-11-29 ENCOUNTER — Encounter: Payer: Self-pay | Admitting: Family Medicine

## 2022-11-29 ENCOUNTER — Ambulatory Visit (INDEPENDENT_AMBULATORY_CARE_PROVIDER_SITE_OTHER): Payer: Medicaid Other | Admitting: Family Medicine

## 2022-11-29 VITALS — BP 114/77 | HR 87 | Ht 63.0 in | Wt 176.5 lb

## 2022-11-29 DIAGNOSIS — N200 Calculus of kidney: Secondary | ICD-10-CM | POA: Diagnosis not present

## 2022-11-29 DIAGNOSIS — J302 Other seasonal allergic rhinitis: Secondary | ICD-10-CM | POA: Diagnosis not present

## 2022-11-29 DIAGNOSIS — Z30011 Encounter for initial prescription of contraceptive pills: Secondary | ICD-10-CM

## 2022-11-29 MED ORDER — CETIRIZINE HCL 10 MG PO TABS: 10.0000 mg | ORAL_TABLET | Freq: Every day | ORAL | 0 refills | Status: AC | PRN

## 2022-11-29 MED ORDER — FLUTICASONE PROPIONATE 50 MCG/ACT NA SUSP: 2.0000 | Freq: Every day | NASAL | 2 refills | Status: AC | PRN

## 2022-11-29 MED ORDER — NORGESTIMATE-ETH ESTRADIOL 0.25-35 MG-MCG PO TABS: 1.0000 | ORAL_TABLET | Freq: Every day | ORAL | 11 refills | Status: AC

## 2022-11-29 NOTE — Patient Instructions (Addendum)
Thank you for visiting clinic today - it is always our pleasure to care for you.  Today we discussed how you've been doing since your kidney stone procedure. It is reassuring that you have not had any symptoms since the procedure. If you notice signs of significant bleeding in your urine, develop a fever, or sharp pains, please either give [place] a call or go to the ED. Otherwise, you appear to be healing well! Your urologist is planning to complete an ultrasound at your next visit with them. This ultrasound will be able to confirm that your stone is gone.   I have sent a prescription for you to begin an oral contraceptive. Please begin this on the day that your next Depo shot would have been due.   Your next scheduled appointment is for Friday 12/27/22 at 4:00PM. See you then!  Reach out any time with any questions or concerns you may have - we are here for you!  Ivery Quale, MD [facility] [phone]

## 2022-11-29 NOTE — Progress Notes (Unsigned)
    SUBJECTIVE:   CHIEF COMPLAINT / HPI:   Kidney stone procedure follow up  Patient recently underwent lithotripsy, stent placement/removal for L nephorlithiasis. Since the procdure 2 weeks ago, patient has been feeling better. She completed a course of Levofloxicin. She has no burning or pain with urination. No recent blood in the urine. No recent fever. Patient is concerned that she has not seen the stone pass in her urine.   Birth control  Patient began Depo injections at age 22 and has noticed gradual increase in weight since then. Patient does not experience menstrual bleeding while on Depo. Patient is very active and would like to try another form of birth control that may not affect her weight as much. She is only interested in oral contraceptives at this time.   PERTINENT  PMH / PSH: Kidney stone s/p lithotripsy and stent removal, Anxiety   OBJECTIVE:   BP 114/77   Pulse 87   Ht 5\' 3"  (1.6 m)   Wt 176 lb 8 oz (80.1 kg)   SpO2 100%   BMI 31.27 kg/m   General: Well-appearing. Resting comfortably in room. CV: Normal S1/S2. No extra heart sounds. Warm and well-perfused. Pulm: Breathing comfortably on room air. CTAB. No increased WOB. Abd: Soft, non-tender, non-distended. No CVA tenderness. Skin:  Warm, dry. Psych: Pleasant and appropriate.    ASSESSMENT/PLAN:   Kidney stone procedure follow up Patient appears to be doing well and healing well since her lithotripsy and stent work. Recent urology notes suggest 1 month fu with urology for renal ultrasound.  - Discussed with and reassured patient that she appears well on exam today and that she seems to be recovering well from her recent procedure  - Discussed hospital return precautions with patient and included in AVS - Patient plans to see urology for follow up   Birth control Discussed BC options including IUDs, Nexplanon, Nuva ring. Patient interested in switching to OC at this time. - Sprintec ordered - Advised  patient to begin taking new OC on the day that her next Depo shot would have been due   Return to clinic in 1 month for follow up. Scheduled for 12/27/22 at 4:00PM.   Ivery Quale, MD [facility]

## 2022-12-05 DIAGNOSIS — N200 Calculus of kidney: Secondary | ICD-10-CM | POA: Diagnosis not present

## 2022-12-06 DIAGNOSIS — N200 Calculus of kidney: Secondary | ICD-10-CM | POA: Diagnosis not present

## 2022-12-09 ENCOUNTER — Ambulatory Visit: Payer: Medicaid Other | Admitting: Student

## 2022-12-24 ENCOUNTER — Ambulatory Visit: Payer: Medicaid Other | Admitting: Student

## 2022-12-24 DIAGNOSIS — L0231 Cutaneous abscess of buttock: Secondary | ICD-10-CM | POA: Diagnosis not present

## 2022-12-27 ENCOUNTER — Ambulatory Visit: Payer: Self-pay | Admitting: Family Medicine

## 2023-01-09 ENCOUNTER — Ambulatory Visit: Payer: Medicaid Other | Admitting: Family Medicine

## 2023-02-03 DIAGNOSIS — J101 Influenza due to other identified influenza virus with other respiratory manifestations: Secondary | ICD-10-CM | POA: Diagnosis not present

## 2023-07-15 ENCOUNTER — Encounter: Payer: Self-pay | Admitting: *Deleted
